# Patient Record
Sex: Female | Born: 1985 | Race: Black or African American | Hispanic: No | Marital: Single | Smoking: Never smoker
Health system: Southern US, Community
[De-identification: ages and names within clinical notes are randomized; demographics above are authoritative.]

## PROBLEM LIST (undated history)

## (undated) DIAGNOSIS — J302 Other seasonal allergic rhinitis: Secondary | ICD-10-CM

## (undated) DIAGNOSIS — E669 Obesity, unspecified: Secondary | ICD-10-CM

## (undated) DIAGNOSIS — J020 Streptococcal pharyngitis: Secondary | ICD-10-CM

---
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
## 2003-06-29 ENCOUNTER — Emergency Department (HOSPITAL_COMMUNITY): Admission: EM | Admit: 2003-06-29 | Discharge: 2003-06-29 | Payer: Self-pay | Admitting: Emergency Medicine

## 2005-09-26 ENCOUNTER — Emergency Department (HOSPITAL_COMMUNITY): Admission: EM | Admit: 2005-09-26 | Discharge: 2005-09-26 | Payer: Self-pay | Admitting: Family Medicine

## 2006-06-10 ENCOUNTER — Emergency Department (HOSPITAL_COMMUNITY): Admission: EM | Admit: 2006-06-10 | Discharge: 2006-06-10 | Payer: Self-pay | Admitting: Emergency Medicine

## 2006-10-16 DIAGNOSIS — E669 Obesity, unspecified: Secondary | ICD-10-CM

## 2006-10-16 DIAGNOSIS — J309 Allergic rhinitis, unspecified: Secondary | ICD-10-CM | POA: Insufficient documentation

## 2006-10-16 DIAGNOSIS — G44209 Tension-type headache, unspecified, not intractable: Secondary | ICD-10-CM

## 2007-10-23 ENCOUNTER — Emergency Department (HOSPITAL_COMMUNITY): Admission: EM | Admit: 2007-10-23 | Discharge: 2007-10-23 | Payer: Self-pay | Admitting: Family Medicine

## 2008-03-17 ENCOUNTER — Emergency Department (HOSPITAL_COMMUNITY): Admission: EM | Admit: 2008-03-17 | Discharge: 2008-03-18 | Payer: Self-pay | Admitting: Emergency Medicine

## 2008-11-03 ENCOUNTER — Emergency Department (HOSPITAL_COMMUNITY): Admission: EM | Admit: 2008-11-03 | Discharge: 2008-11-03 | Payer: Self-pay | Admitting: Family Medicine

## 2009-12-15 ENCOUNTER — Emergency Department (HOSPITAL_COMMUNITY): Admission: EM | Admit: 2009-12-15 | Discharge: 2009-12-15 | Payer: Self-pay | Admitting: Family Medicine

## 2011-05-03 ENCOUNTER — Inpatient Hospital Stay (INDEPENDENT_AMBULATORY_CARE_PROVIDER_SITE_OTHER)
Admission: RE | Admit: 2011-05-03 | Discharge: 2011-05-03 | Disposition: A | Discharge status: Home / Self Care | Payer: Self-pay | Source: Ambulatory Visit | Attending: Family Medicine | Admitting: Family Medicine

## 2011-05-03 DIAGNOSIS — M545 Low back pain: Secondary | ICD-10-CM

## 2011-05-13 LAB — STREP A DNA PROBE: Group A Strep Probe: NEGATIVE

## 2011-05-13 LAB — POCT INFECTIOUS MONO SCREEN: Mono Screen: NEGATIVE

## 2011-05-17 LAB — CBC
HCT: 36.7
Hemoglobin: 12.5
MCHC: 33.9
MCV: 84
Platelets: 285
RBC: 4.37
RDW: 12.8
WBC: 15.1 — ABNORMAL HIGH

## 2011-05-17 LAB — DIFFERENTIAL
Basophils Absolute: 0
Basophils Relative: 0
Eosinophils Absolute: 0.1
Eosinophils Relative: 1
Lymphocytes Relative: 13
Lymphs Abs: 2
Monocytes Absolute: 1.3 — ABNORMAL HIGH
Monocytes Relative: 9
Neutro Abs: 11.6 — ABNORMAL HIGH
Neutrophils Relative %: 77

## 2011-05-17 LAB — RAPID STREP SCREEN (MED CTR MEBANE ONLY): Streptococcus, Group A Screen (Direct): NEGATIVE

## 2011-08-04 ENCOUNTER — Encounter: Payer: Self-pay | Admitting: *Deleted

## 2011-08-04 ENCOUNTER — Emergency Department (INDEPENDENT_AMBULATORY_CARE_PROVIDER_SITE_OTHER)
Admission: EM | Admit: 2011-08-04 | Discharge: 2011-08-04 | Disposition: A | Discharge status: Home / Self Care | Payer: Self-pay | Source: Home / Self Care | Attending: Family Medicine | Admitting: Family Medicine

## 2011-08-04 DIAGNOSIS — J020 Streptococcal pharyngitis: Secondary | ICD-10-CM

## 2011-08-04 DIAGNOSIS — J0300 Acute streptococcal tonsillitis, unspecified: Secondary | ICD-10-CM

## 2011-08-04 HISTORY — DX: Other seasonal allergic rhinitis: J30.2

## 2011-08-04 MED ORDER — PENICILLIN V POTASSIUM 500 MG PO TABS: 500.0000 mg | ORAL_TABLET | Freq: Four times a day (QID) | ORAL | Status: AC

## 2011-08-04 NOTE — ED Provider Notes (Signed)
History     CSN: [id number] Arrival date & time: 08/04/2011  6:50 PM   First MD Initiated Contact with Patient 08/04/11 1858      No chief complaint on file.   (Consider location/radiation/quality/duration/timing/severity/associated sxs/prior treatment) Patient is a 25 y.o. female presenting with pharyngitis. The history is provided by the patient. No language interpreter was used.  Sore Throat This is a new problem. The current episode started yesterday. The problem occurs constantly. The problem has been gradually worsening. Associated symptoms include headaches. The symptoms are aggravated by nothing. The symptoms are relieved by NSAIDs. She has tried nothing for the symptoms. The treatment provided moderate relief.   Pt has a sorethroat with exudate on tonsils No past medical history on file.  No past surgical history on file.  No family history on file.  History  Substance Use Topics  . Smoking status: Not on file  . Smokeless tobacco: Not on file  . Alcohol Use: Not on file    OB History    No data available      Review of Systems  Neurological: Positive for headaches.  All other systems reviewed and are negative.    Allergies  Review of patient's allergies indicates not on file.  Home Medications  No current outpatient prescriptions on file.  There were no vitals taken for this visit.  Physical Exam  Constitutional: She appears well-developed.  HENT:  Head: Normocephalic and atraumatic.  Right Ear: External ear normal.  Left Ear: External ear normal.  Nose: Nose normal.  Mouth/Throat: Oropharyngeal exudate present.  Eyes: Conjunctivae are normal. Pupils are equal, round, and reactive to light.  Neck: Normal range of motion.  Cardiovascular: Normal rate.   Pulmonary/Chest: Effort normal.  Abdominal: Soft.  Musculoskeletal: Normal range of motion.  Neurological: She is alert.  Skin: Skin is warm.  Psychiatric: She has a normal mood and affect.     ED Course  Procedures (including critical care time)  Labs Reviewed  POCT RAPID STREP A ([facility] ONLY) - Abnormal; Notable for the following:    Streptococcus, Group A Screen (Direct) POSITIVE (*)    All other components within normal limits   No results found.   No diagnosis found.    MDM  Strep positive        Langston Masker, [place] 08/04/11 1918

## 2011-08-04 NOTE — ED Provider Notes (Signed)
Medical screening examination/treatment/procedure(s) were performed by non-physician practitioner and as supervising physician I was immediately available for consultation/collaboration.   KINDL,JAMES DOUGLAS MD.    James Douglas Kindl, MD 08/04/11 2010 

## 2011-08-04 NOTE — ED Notes (Signed)
sorethroat with swelling onset today

## 2011-08-26 ENCOUNTER — Encounter (HOSPITAL_COMMUNITY): Payer: Self-pay

## 2011-08-26 ENCOUNTER — Emergency Department (INDEPENDENT_AMBULATORY_CARE_PROVIDER_SITE_OTHER)
Admission: EM | Admit: 2011-08-26 | Discharge: 2011-08-26 | Disposition: A | Discharge status: Home / Self Care | Payer: Self-pay | Source: Home / Self Care | Attending: Emergency Medicine | Admitting: Emergency Medicine

## 2011-08-26 DIAGNOSIS — J020 Streptococcal pharyngitis: Secondary | ICD-10-CM

## 2011-08-26 HISTORY — DX: Streptococcal pharyngitis: J02.0

## 2011-08-26 HISTORY — DX: Obesity, unspecified: E66.9

## 2011-08-26 LAB — POCT RAPID STREP A: Streptococcus, Group A Screen (Direct): POSITIVE — AB

## 2011-08-26 MED ORDER — IBUPROFEN 600 MG PO TABS: 600.0000 mg | ORAL_TABLET | Freq: Four times a day (QID) | ORAL | Status: AC | PRN

## 2011-08-26 MED ORDER — CEPHALEXIN 500 MG PO CAPS: 500.0000 mg | ORAL_CAPSULE | Freq: Four times a day (QID) | ORAL | Status: AC

## 2011-08-26 NOTE — ED Notes (Signed)
C/o sorethroat since Saturday.  Reports being diagnosed with strep throat on 08/04/11, completed antibiotics and sx resolved.  Denies fever or cold sx.

## 2011-08-26 NOTE — ED Provider Notes (Signed)
History     CSN: [id number]  Arrival date & time 08/26/11  1052   First MD Initiated Contact with Patient 08/26/11 1217      Chief Complaint  Patient presents with  . Sore Throat    (Consider location/radiation/quality/duration/timing/severity/associated sxs/prior treatment) HPI Comments: Pt with ST x 3 days. Some L ear pain today. No fevers, N/V, rash, abd pain, voice changes. Able to eat and drink.  dx'd with and tx'd for strep throat 12/16. Home on 10 days of pcn which pt states she finished. States she got better after tx. bp also elevated 140's/90's on that visit  Patient is a 26 y.o. female presenting with pharyngitis. The history is provided by the patient. No language interpreter was used.  Sore Throat This is a new problem. The current episode started more than 2 days ago. The problem occurs constantly. The problem has been gradually worsening. Pertinent negatives include no chest pain, no abdominal pain, no headaches and no shortness of breath. The symptoms are aggravated by swallowing.    Past Medical History  Diagnosis Date  . Seasonal allergies   . Strep throat   . Obesity     History reviewed. No pertinent past surgical history.  History reviewed. No pertinent family history.  History  Substance Use Topics  . Smoking status: Never Smoker   . Smokeless tobacco: Not on file  . Alcohol Use: No    OB History    Grav Para Term Preterm Abortions TAB SAB Ect Mult Living                  Review of Systems  Constitutional: Negative for fever.  HENT: Positive for ear pain, congestion and sore throat. Negative for hearing loss, rhinorrhea, sneezing, drooling, mouth sores, trouble swallowing, voice change, postnasal drip and sinus pressure.   Respiratory: Negative for cough, shortness of breath and wheezing.   Cardiovascular: Negative for chest pain.  Gastrointestinal: Negative for nausea, vomiting and abdominal pain.  Musculoskeletal: Negative for myalgias.    Skin: Negative for rash.  Neurological: Negative for headaches.    Allergies  Review of patient's allergies indicates no known allergies.  Home Medications   Current Outpatient Rx  Name Route Sig Dispense Refill  . CEPHALEXIN 500 MG PO CAPS Oral Take 1 capsule (500 mg total) by mouth 4 (four) times daily. X 10 days 40 capsule 0  . IBUPROFEN 600 MG PO TABS Oral Take 1 tablet (600 mg total) by mouth every 6 (six) hours as needed for pain. 30 tablet 0    BP 160/91  Pulse 94  Temp(Src) 99.5 F (37.5 C) (Oral)  Resp 16  SpO2 100%  LMP 08/04/2011  Physical Exam  Nursing note and vitals reviewed. Constitutional: She is oriented to person, place, and time. She appears well-developed and well-nourished.  HENT:  Head: Normocephalic and atraumatic. No trismus in the jaw.  Right Ear: Tympanic membrane and ear canal normal.  Left Ear: Tympanic membrane and ear canal normal.  Nose: Mucosal edema and rhinorrhea present. Right sinus exhibits no maxillary sinus tenderness and no frontal sinus tenderness. Left sinus exhibits no maxillary sinus tenderness and no frontal sinus tenderness.  Mouth/Throat: Uvula is midline and mucous membranes are normal. Posterior oropharyngeal edema and posterior oropharyngeal erythema present. No oropharyngeal exudate or tonsillar abscesses.       Enlarged, erythematous tonsils bilaterally. No exudates.   Eyes: Conjunctivae and EOM are normal. Pupils are equal, round, and reactive to light.  Neck: Normal  range of motion. Neck supple.  Cardiovascular: Normal rate, regular rhythm and normal heart sounds.   Pulmonary/Chest: Effort normal and breath sounds normal. No respiratory distress. She has no wheezes. She has no rales.  Abdominal: She exhibits no distension. There is no tenderness. There is no rebound and no guarding.  Musculoskeletal: Normal range of motion.  Lymphadenopathy:    She has cervical adenopathy.  Neurological: She is alert and oriented to  person, place, and time.  Skin: Skin is warm and dry. No rash noted.  Psychiatric: She has a normal mood and affect. Her behavior is normal. Judgment and thought content normal.    ED Course  Procedures (including critical care time)  Labs Reviewed  POCT RAPID STREP A ([facility] ONLY) - Abnormal; Notable for the following:    Streptococcus, Group A Screen (Direct) POSITIVE (*)    All other components within normal limits   No results found.   1. Strep pharyngitis       MDM  Previous chart, labs reviewed as noted in HPI. Pt with recurrent pharyngitis, will send home with keflex. Pt hypertensive today. Also discussed BP and lifestyle modifications as important first steps but adivsed she needs to have BP rechecked in a month and may need medication at that time.   Luiz Blare, MD 08/26/11 [phone]

## 2018-05-12 ENCOUNTER — Emergency Department (HOSPITAL_COMMUNITY)
Admission: EM | Admit: 2018-05-12 | Discharge: 2018-05-12 | Disposition: A | Discharge status: Home / Self Care | Payer: Self-pay | Attending: Emergency Medicine | Admitting: Emergency Medicine

## 2018-05-12 ENCOUNTER — Encounter (HOSPITAL_COMMUNITY): Payer: Self-pay | Admitting: *Deleted

## 2018-05-12 ENCOUNTER — Emergency Department (HOSPITAL_COMMUNITY): Payer: Self-pay

## 2018-05-12 ENCOUNTER — Other Ambulatory Visit: Payer: Self-pay

## 2018-05-12 DIAGNOSIS — Y999 Unspecified external cause status: Secondary | ICD-10-CM | POA: Insufficient documentation

## 2018-05-12 DIAGNOSIS — Y929 Unspecified place or not applicable: Secondary | ICD-10-CM | POA: Insufficient documentation

## 2018-05-12 DIAGNOSIS — S92251A Displaced fracture of navicular [scaphoid] of right foot, initial encounter for closed fracture: Secondary | ICD-10-CM | POA: Insufficient documentation

## 2018-05-12 DIAGNOSIS — M79662 Pain in left lower leg: Secondary | ICD-10-CM | POA: Insufficient documentation

## 2018-05-12 DIAGNOSIS — Z79899 Other long term (current) drug therapy: Secondary | ICD-10-CM | POA: Insufficient documentation

## 2018-05-12 DIAGNOSIS — W1842XA Slipping, tripping and stumbling without falling due to stepping into hole or opening, initial encounter: Secondary | ICD-10-CM | POA: Insufficient documentation

## 2018-05-12 DIAGNOSIS — Y939 Activity, unspecified: Secondary | ICD-10-CM | POA: Insufficient documentation

## 2018-05-12 MED ORDER — IBUPROFEN 200 MG PO TABS
600.0000 mg | ORAL_TABLET | Freq: Once | ORAL | Status: AC
  Administered 2018-05-12: 600 mg via ORAL
  Filled 2018-05-12: qty 3

## 2018-05-12 MED ORDER — IBUPROFEN 600 MG PO TABS: 600.0000 mg | ORAL_TABLET | Freq: Four times a day (QID) | ORAL | 0 refills | Status: DC | PRN

## 2018-05-12 NOTE — ED Triage Notes (Signed)
Pt reports rolling on her right foot on Saturday.  Pt thought it may have been a sprain so she had wrapped it and elevated it.  Today the pain and swelling got worse.  Pt a/o x 4.

## 2018-05-12 NOTE — ED Provider Notes (Signed)
[facility]-EMERGENCY DEPT Provider Note   CSN: [id number] Arrival date & time: 05/12/18  1311     History   Chief Complaint Chief Complaint  Patient presents with  . Foot Pain    right    HPI Amy Keller is a 32 y.o. female.  HPI   Amy Keller is a 32 y.o. female, with a history of obesity, presenting to the ED with injury to the right foot and ankle that occurred September 21.  States she stepped in a hole and inverted her right ankle.  She has pain primarily to the anterior ankle and dorsal foot.  Accompanied by swelling.  Pain is throbbing/sharp, 5/10, radiating proximally along the tibia. She has been taking ibuprofen with some relief.  Last ibuprofen was last night.  Denies neck/back pain, numbness, weakness, hip pain, or any other injuries or complaints.   Past Medical History:  Diagnosis Date  . Obesity   . Seasonal allergies   . Strep throat     Patient Active Problem List   Diagnosis Date Noted  . OBESITY, NOS 10/16/2006  . TENSION HEADACHE 10/16/2006  . RHINITIS, ALLERGIC 10/16/2006    History reviewed. No pertinent surgical history.   OB History   None      Home Medications    Prior to Admission medications   Medication Sig Start Date End Date Taking? Authorizing Provider  cetirizine (ZYRTEC) 10 MG tablet Take 10 mg by mouth daily.   Yes [provider]  ibuprofen (ADVIL,MOTRIN) 200 MG tablet Take 200-800 mg by mouth every 6 (six) hours as needed for moderate pain.   Yes [provider]    Family History No family history on file.  Social History Social History   Tobacco Use  . Smoking status: Never Smoker  . Smokeless tobacco: Never Used  Substance Use Topics  . Alcohol use: No  . Drug use: No     Allergies   Patient has no known allergies.   Review of Systems Review of Systems  Musculoskeletal: Positive for arthralgias. Negative for back pain and neck pain.  Neurological:  Negative for weakness and numbness.     Physical Exam Updated Vital Signs BP (!) 164/97 (BP Location: Left Arm)   Pulse 98   Temp 98.7 F (37.1 C) (Oral)   Resp 17   Ht 5\' 7"  (1.702 m)   LMP 04/26/2018   SpO2 99%   Physical Exam  Constitutional: She appears well-developed and well-nourished. No distress.  HENT:  Head: Normocephalic and atraumatic.  Eyes: Conjunctivae are normal.  Neck: Neck supple.  Cardiovascular: Normal rate, regular rhythm and intact distal pulses.  Pulmonary/Chest: Effort normal.  Musculoskeletal: She exhibits edema and tenderness. She exhibits no deformity.       Legs:      Feet:  Swelling and tenderness to the right anterior ankle and into the dorsal foot without noted deformity or instability.  Full range of motion in the ankle, though painful. Mild tenderness and some pain with range of motion at the right proximal tibia without noted instability or swelling.  Neurological: She is alert.  Skin: Skin is warm and dry. Capillary refill takes less than 2 seconds. She is not diaphoretic. No pallor.  Psychiatric: She has a normal mood and affect. Her behavior is normal.  Nursing note and vitals reviewed.    ED Treatments / Results  Labs (all labs ordered are listed, but only abnormal results are displayed) Labs Reviewed -  No data to display  EKG None  Radiology Dg Tibia/fibula Right  Result Date: 05/12/2018 CLINICAL DATA:  Twisting injury of right foot and ankle. EXAM: RIGHT TIBIA AND FIBULA - 2 VIEW COMPARISON:  None. FINDINGS: Soft tissue swelling about the ankle. No acute bony abnormality. Specifically, no fracture, subluxation, or dislocation. Joint spaces maintained. IMPRESSION: No acute bony abnormality. Electronically Signed   By: Charlett NoseKevin  Dover M.D.   On: 05/12/2018 17:56   Dg Ankle Complete Right  Result Date: 05/12/2018 CLINICAL DATA:  Patient rolled ankle on Saturday and presents with worsening pain. EXAM: RIGHT ANKLE - COMPLETE 3+ VIEW  COMPARISON:  None. FINDINGS: Soft tissue swelling about the malleoli more so medially. Slightly displaced appearing lucency of the navicular bone is identified medially extending into the navicular-cuneiform articulation. The tibiotalar, subtalar and midfoot articulations are maintained. There is a small plantar calcaneal enthesophyte. IMPRESSION: Soft tissue swelling about the malleoli. Suspicious lucency involving the tarsal navicular with slight displacement compatible with a recent fracture. Electronically Signed   By: Tollie Ethavid  Kwon M.D.   On: 05/12/2018 15:51   Dg Foot Complete Right  Result Date: 05/12/2018 CLINICAL DATA:  Patient rolled foot on Saturday and presents with worsening pain and swelling. EXAM: RIGHT FOOT COMPLETE - 3+ VIEW COMPARISON:  None. FINDINGS: There is moderate soft tissue swelling about the malleoli and dorsum of the forefoot. Subtle transverse lucency along the posterior medial aspect of the tarsal navicular is identified suspicious for a nondisplaced fracture at site of tibialis posterior attachment. Subtle ill-defined lucencies involving the plantar medial aspect of the calcaneus anteriorly is nonspecific but could potentially represent subtle fracture as well. The tibiotalar and subtalar joints are maintained. No joint dislocation is identified. Tiny plantar calcaneal enthesophyte is identified. IMPRESSION: Suspicious transverse lucency along the posteromedial aspect of the tarsal navicular with overlying soft tissue swelling is noted. Findings are suspicious for a non avulsed, nondisplaced fracture at site of tibialis posterior insertion. Ill-defined nonspecific lucency of the anteromedial calcaneus would be difficult to entirely exclude a fracture. CT may help for better assessment. Electronically Signed   By: Tollie Ethavid  Kwon M.D.   On: 05/12/2018 15:39    Procedures Procedures (including critical care time)  Medications Ordered in ED Medications  ibuprofen (ADVIL,MOTRIN)  tablet 600 mg (600 mg Oral Given 05/12/18 1610)     Initial Impression / Assessment and Plan / ED Course  I have reviewed the triage vital signs and the nursing notes.  Pertinent labs & imaging results that were available during my care of the patient were reviewed by me and considered in my medical decision making (see chart for details).  Clinical Course as of May 12 1801  Tue May 12, 2018  1703 Spoke with Dr. Aundria Rudogers, orthopedist. Recommends placing patient in cam boot with crutches. He can see her in the office within 5-7 days.    [SJ]    Clinical Course User Index [SJ] Joy, Shawn C, PA-C   Patient presents with a right foot injury.  Neurovascularly intact.  Evidence of tarsal navicular fracture on x-ray.  Cam boot, crutches, and orthopedic follow-up. The patient was given instructions for home care as well as return precautions. Patient voices understanding of these instructions, accepts the plan, and is comfortable with discharge.  Findings and plan of care discussed with Virgina NorfolkAdam Curatolo, DO.   Final Clinical Impressions(s) / ED Diagnoses   Final diagnoses:  Closed displaced fracture of navicular bone of right foot, initial encounter    ED  Discharge Orders    None       Concepcion Living 05/12/18 1802    Virgina Norfolk, DO 05/13/18 0021

## 2018-05-12 NOTE — ED Notes (Signed)
Ortho tech contacted to apply cam walker and crutches

## 2018-05-12 NOTE — Discharge Instructions (Addendum)
You have been seen today for a foot/ankle injury. There was evidence of a fracture on the xray.  Antiinflammatory medications: Take 600 mg of ibuprofen every 6 hours or 440 mg (over the counter dose) to 500 mg (prescription dose) of naproxen every 12 hours for the next 3 days. After this time, these medications may be used as needed for pain. Take these medications with food to avoid upset stomach. Choose only one of these medications, do not take them together. Acetaminophen (generic for Tylenol): Should you continue to have additional pain while taking the ibuprofen or naproxen, you may add in acetaminophen as needed. Your daily total maximum amount of acetaminophen from all sources should be limited to 4000mg /day for persons without liver problems, or 2000mg /day for those with liver problems. Ice: May apply ice to the area over the next 24 hours for 15 minutes at a time to reduce swelling. Elevation: Keep the extremity elevated as often as possible to reduce pain and inflammation. Support: Wear the cam boot for support and comfort. Wear this until pain resolves. You will be weight-bearing as tolerated, which means you can slowly start to put weight on the extremity and increase amount and frequency as pain allows. Follow up: Follow-up with the orthopedic specialist on this matter.  Call the number provided to set up an appointment.  They should see you within the next 5 to 7 days. Return: Return to the ED for numbness, weakness, increasing pain, overall worsening symptoms, loss of function, or if symptoms are not improving, you have tried to follow up with the orthopedic specialist, and have been unable to do so.  For prescription assistance, may try using prescription discount sites or apps, such as goodrx.com

## 2018-05-12 NOTE — ED Notes (Signed)
Pt reports stepping in a hole Saturday twisting her R ankle.  Edema noted in her R ankle laterally.  R pedal pulse palpated.  She is able to move her toes without difficulty.  She was able to ambulate to the room with a limp.

## 2018-08-18 ENCOUNTER — Telehealth: Payer: Self-pay | Admitting: Physician Assistant

## 2018-08-18 DIAGNOSIS — M545 Low back pain, unspecified: Secondary | ICD-10-CM

## 2018-08-18 MED ORDER — NAPROXEN 500 MG PO TABS: 500.0000 mg | ORAL_TABLET | Freq: Two times a day (BID) | ORAL | 0 refills | Status: DC

## 2018-08-18 MED ORDER — CYCLOBENZAPRINE HCL 10 MG PO TABS: 5.0000 mg | ORAL_TABLET | Freq: Three times a day (TID) | ORAL | 0 refills | Status: DC | PRN

## 2018-08-18 NOTE — Progress Notes (Signed)
We are sorry that you are not feeling well.  Here is how we plan to help!  Based on what you have shared with me it looks like you mostly have acute back pain.  Acute back pain is defined as musculoskeletal pain that can resolve in 1-3 weeks with conservative treatment.  I have prescribed Naprosyn 500 mg twice a day non-steroid anti-inflammatory (NSAID) as well as Flexeril 10 mg every eight hours as needed which is a muscle relaxer  Some patients experience stomach irritation or in increased heartburn with anti-inflammatory drugs.  Please keep in mind that muscle relaxer's can cause fatigue and should not be taken while at work or driving.  Back pain is very common.  The pain often gets better over time.  The cause of back pain is usually not dangerous.  Most people can learn to manage their back pain on their own.  Home Care Stay active.  Start with short walks on flat ground if you can.  Try to walk farther each day. Do not sit, drive or stand in one place for more than 30 minutes.  Do not stay in bed. Do not avoid exercise or work.  Activity can help your back heal faster. Be careful when you bend or lift an object.  Bend at your knees, keep the object close to you, and do not twist. Sleep on a firm mattress.  Lie on your side, and bend your knees.  If you lie on your back, put a pillow under your knees. Only take medicines as told by your doctor. Put ice on the injured area. Put ice in a plastic bag Place a towel between your skin and the bag Leave the ice on for 15-20 minutes, 3-4 times a day for the first 2-3 days. 210 After that, you can switch between ice and heat packs. Ask your doctor about back exercises or massage. Avoid feeling anxious or stressed.  Find good ways to deal with stress, such as exercise.  Get Help Right Way If: Your pain does not go away with rest or medicine. Your pain does not go away in 1 week. You have new problems. You do not feel well. The pain spreads  into your legs. You cannot control when you poop (bowel movement) or pee (urinate) You feel sick to your stomach (nauseous) or throw up (vomit) You have belly (abdominal) pain. You feel like you may pass out (faint). If you develop a fever.  Make Sure you: Understand these instructions. Will watch your condition Will get help right away if you are not doing well or get worse.  Your e-visit answers were reviewed by a board certified advanced clinical practitioner to complete your personal care plan.  Depending on the condition, your plan could have included both over the counter or prescription medications.  If there is a problem please reply once you have received a response from your provider.  Your safety is important to [place].  If you have drug allergies check your prescription carefully.    You can use MyChart to ask questions about today's visit, request a non-urgent call back, or ask for a work or school excuse for 24 hours related to this e-Visit. If it has been greater than 24 hours you will need to follow up with your provider, or enter a new e-Visit to address those concerns.  You will get an e-mail in the next two days asking about your experience.  I hope that your e-visit has been valuable  and will speed your recovery. Thank you for using e-visits.   ===View-only below this line===   ----- Message -----    From: Amy Keller    Sent: 08/18/2018 10:09 AM EST      To: E-Visit Mailing List Subject: E-Visit Submission: Back Pain  E-Visit Submission: Back Pain --------------------------------  Question: Where are you having pain Answer:   Lower Back  Question: Does the pain extend into your legs? Answer:   Yes, into both legs  Question: How bad is the pain? Answer:   The pain is severe  Question: Did you have an injury that caused the pain? Answer:   Yes, the pain started after an injury  Question: Please describe the circumstances of your injury Answer:   We  were cleaning out my aunts apartment yesterday and i went to stand up after bending over and felt a sharp pain and couldn't move, I had to have help sitting down. It felt like my back went out completely.  Question: Was your injury related to your job? Answer:   No  Question: How long has the pain been present? Answer:   Today and yesterday  Question: Have you had back pain in the past? Answer:   Yes, I have infrequently had pain similar to this before  Question: Please list any medications you have previously taken for back pain. Answer:   [facility] went to [facility] few years back for pains similar to this. I was prescribed A pain reliever and a muscle relaxer. They helped out Immediately with the pain.  Question: Do you have a fever? Answer:   No, I do not have a fever  Question: Do you have any of the following? Answer:   None of the above  Question: What makes the pain worse? Answer:   Any movement  Question: What makes the pain better Answer:   Pain medicine  Question: Have you ever been diagnosed with cancer? Answer:   No  Question: Have you ever been diagnosed with arthritis? Answer:   No  Question: Have you ever been diagnosed with osteoporosis or any other bone weakness? Answer:   No  Question: Have you ever had surgery on your back or spine? Answer:   No  Question: What is your usual health status? Answer:   I am active and can move normally  Question: Are you pregnant? Answer:   I am confident that I am not pregnant  Question: Are you breastfeeding? Answer:   No  Question: Please list your medication allergies that you may have ? (If 'none' , please list as 'none') Answer:   None  Question: Please list any additional comments  Answer:   I have been tak8ng 800ml of over the counter  Ibprophen and using a heating pad since it happend, but am not getting any relief.

## 2019-12-06 IMAGING — CR DG ANKLE COMPLETE 3+V*R*
3 series · 3 of 3 positions shown · non-contrast
Comparison: None.

CLINICAL DATA: Patient rolled ankle on [REDACTED] and presents with
worsening pain.

EXAM:
RIGHT ANKLE - COMPLETE 3+ VIEW

[x ankle lat right]
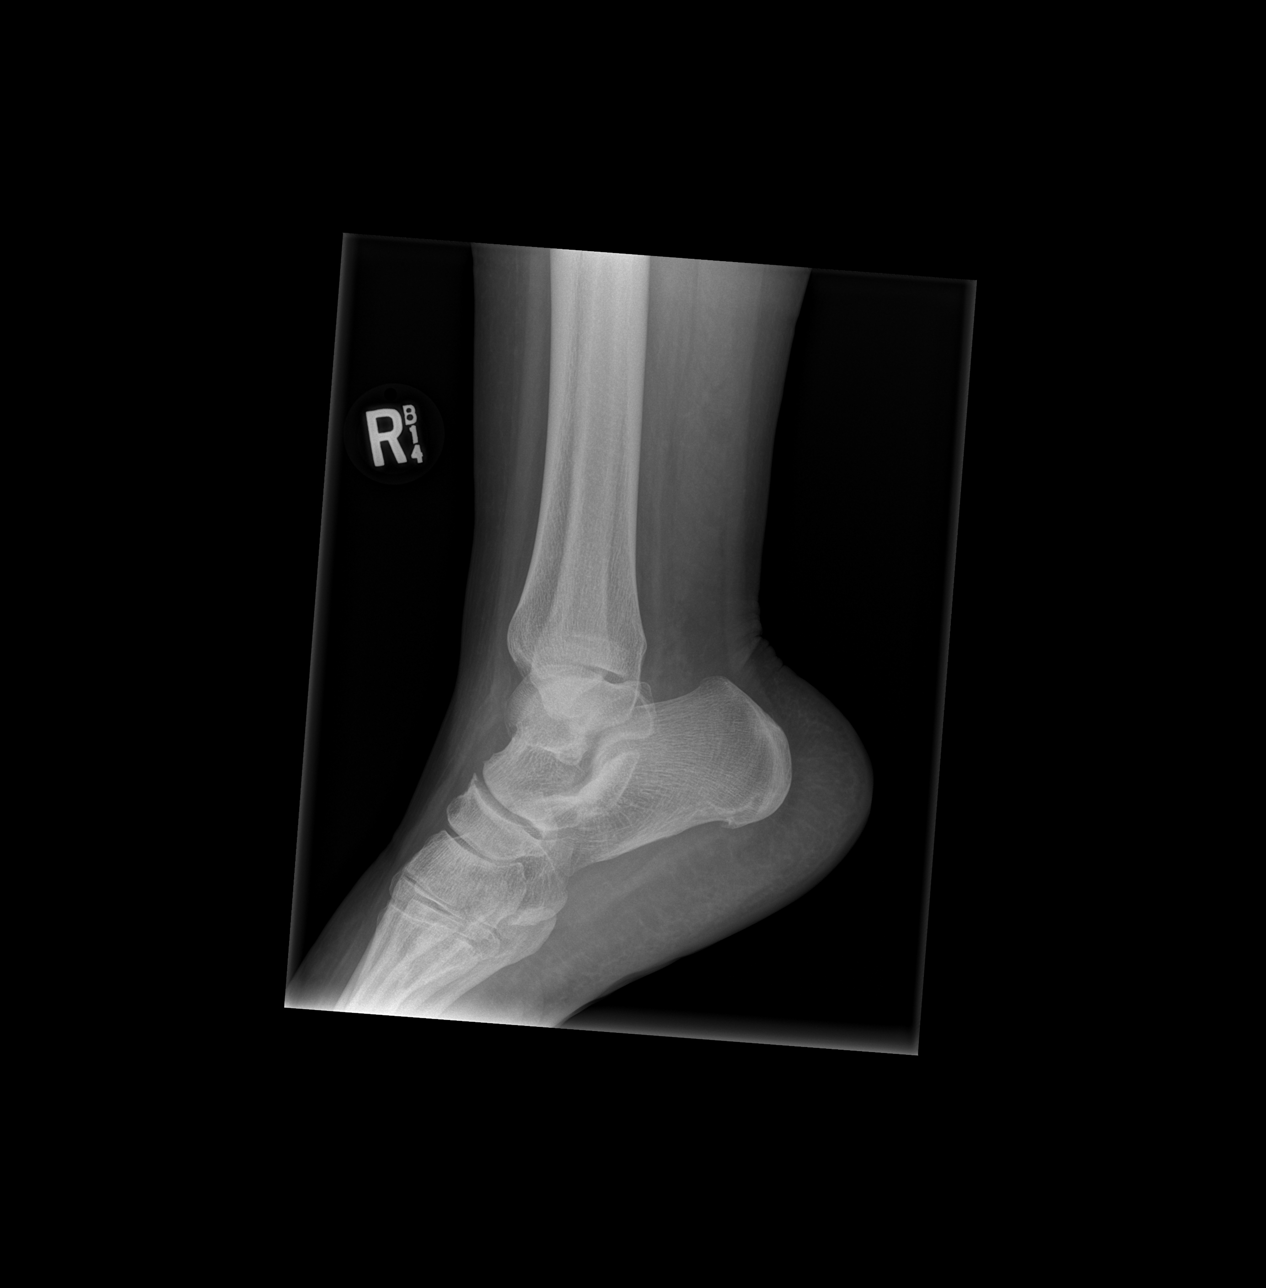

[x ankle ap right]
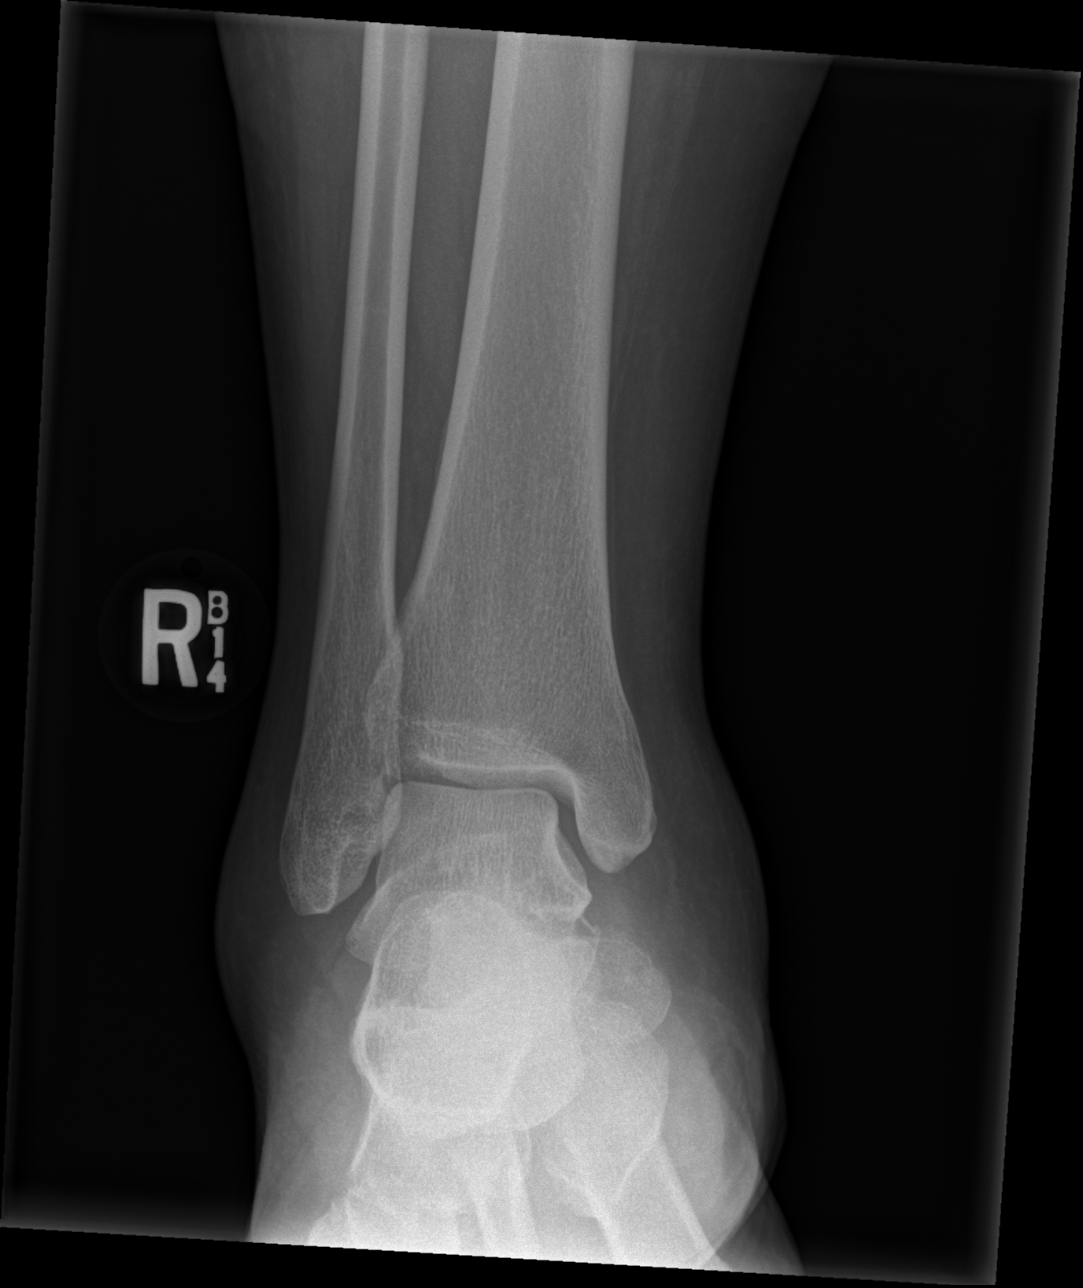

[x ankle obl right]
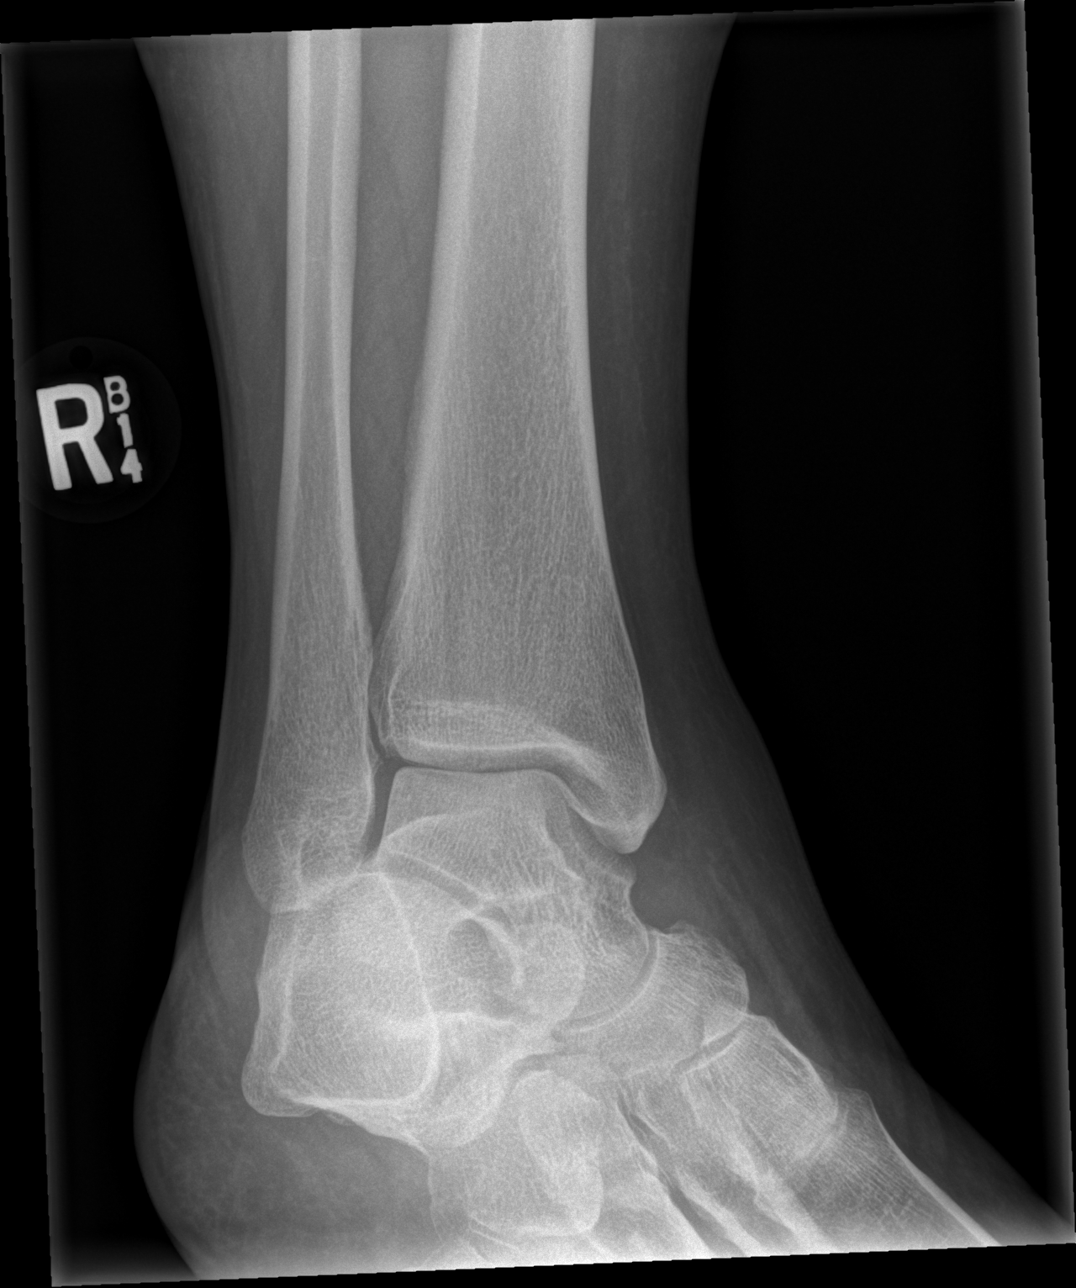

[3 of 3 positions shown; findings below may reference images not displayed]

FINDINGS: Soft tissue swelling about the malleoli more so medially. Slightly
displaced appearing lucency of the navicular bone is identified
medially extending into the navicular-cuneiform articulation. The
tibiotalar, subtalar and midfoot articulations are maintained. There
is a small plantar calcaneal enthesophyte.
IMPRESSION: Soft tissue swelling about the malleoli. Suspicious lucency
involving the tarsal navicular with slight displacement compatible
with a recent fracture.

## 2020-04-17 ENCOUNTER — Ambulatory Visit: Payer: Self-pay | Admitting: Podiatry

## 2020-04-19 ENCOUNTER — Telehealth (INDEPENDENT_AMBULATORY_CARE_PROVIDER_SITE_OTHER): Payer: 59 | Admitting: Podiatry

## 2020-04-19 ENCOUNTER — Ambulatory Visit (INDEPENDENT_AMBULATORY_CARE_PROVIDER_SITE_OTHER): Payer: 59 | Admitting: Podiatry

## 2020-04-19 ENCOUNTER — Encounter: Payer: Self-pay | Admitting: Podiatry

## 2020-04-19 ENCOUNTER — Ambulatory Visit (INDEPENDENT_AMBULATORY_CARE_PROVIDER_SITE_OTHER): Payer: 59

## 2020-04-19 ENCOUNTER — Other Ambulatory Visit: Payer: Self-pay

## 2020-04-19 DIAGNOSIS — M778 Other enthesopathies, not elsewhere classified: Secondary | ICD-10-CM

## 2020-04-19 DIAGNOSIS — M7750 Other enthesopathy of unspecified foot: Secondary | ICD-10-CM | POA: Diagnosis not present

## 2020-04-19 DIAGNOSIS — M7751 Other enthesopathy of right foot: Secondary | ICD-10-CM | POA: Diagnosis not present

## 2020-04-19 DIAGNOSIS — M779 Enthesopathy, unspecified: Secondary | ICD-10-CM

## 2020-04-19 NOTE — Progress Notes (Signed)
Subjective:   Patient ID: Amy Keller, female   DOB: 34 y.o.   MRN: [id number]   HPI Patient presents stating that she broke her foot 2 years ago had a cast put on and only wear a very short period of time and states that since that time she has had swelling in her ankle and into her foot and it is low-grade but bothersome and she cannot be active.  Patient is moderately obese and this is part of the problem does not smoke likes to be active   Review of Systems  All other systems reviewed and are negative.       Objective:  Physical Exam Vitals and nursing note reviewed.  Constitutional:      Appearance: She is well-developed.  Pulmonary:     Effort: Pulmonary effort is normal.  Musculoskeletal:        General: Normal range of motion.  Skin:    General: Skin is warm.  Neurological:     Mental Status: She is alert.     Neurovascular status was found to be intact muscle strength was found to be adequate range of motion was slightly reduced right subtalar joint.  The area that they saw the previous fracture navicular looks fine and I pressed deep into the tissue and did not note pathology there is quite a bit of discomfort into the sinus tarsi right with moderate edema in the ankle negative Denna Haggard' sign noted.  Patient has good digital perfusion well oriented x3     Assessment:  Probability for inflammatory sinus tarsitis which may be related to compensation from the previous injury which appears to be at the navicular proximal portion     Plan:  H&P multiple x-rays reviewed and today I did sterile prep and injected the sinus tarsi 3 mg Kenalog 5 mg Xylocaine dispensed ankle compression stocking advised on elevation and will be seen back to recheck again in the next 4 weeks.  Educated her on nature of compensation deformity x-rays indicate that there appears to be a previous fracture of the navicular but it appears to be healed and I did not note any subtalar joint or ankle  arthritis

## 2020-04-19 NOTE — Telephone Encounter (Signed)
Patient had foot injection. She called wanting to know if there are any restrictions; specifically if she can work out at [employer]

## 2020-04-20 ENCOUNTER — Telehealth (INDEPENDENT_AMBULATORY_CARE_PROVIDER_SITE_OTHER): Payer: 59 | Admitting: Podiatry

## 2020-04-20 NOTE — Telephone Encounter (Signed)
No restrictions

## 2020-04-20 NOTE — Telephone Encounter (Signed)
Phoned patient and LVM informing her she has no restrictions per Dr. Charlsie Merles. She may go to the gym.

## 2020-04-21 ENCOUNTER — Telehealth: Payer: Self-pay | Admitting: *Deleted

## 2020-04-21 NOTE — Telephone Encounter (Signed)
Left a voicemail message with patient telling her that Dr. Charlsie Merles said there are no restrictions for patient to work out. Patient can work out.

## 2020-05-10 ENCOUNTER — Ambulatory Visit: Payer: 59 | Admitting: Podiatry

## 2020-05-17 ENCOUNTER — Other Ambulatory Visit: Payer: Self-pay

## 2020-05-17 ENCOUNTER — Encounter: Payer: Self-pay | Admitting: Podiatry

## 2020-05-17 ENCOUNTER — Ambulatory Visit (INDEPENDENT_AMBULATORY_CARE_PROVIDER_SITE_OTHER): Payer: 59 | Admitting: Podiatry

## 2020-05-17 DIAGNOSIS — M7750 Other enthesopathy of unspecified foot: Secondary | ICD-10-CM

## 2020-05-17 DIAGNOSIS — M779 Enthesopathy, unspecified: Secondary | ICD-10-CM

## 2020-05-17 MED ORDER — DICLOFENAC SODIUM 75 MG PO TBEC: 75.0000 mg | DELAYED_RELEASE_TABLET | Freq: Two times a day (BID) | ORAL | 2 refills | Status: DC

## 2020-05-17 NOTE — Progress Notes (Signed)
Subjective:   Patient ID: Amy Keller, female   DOB: 34 y.o.   MRN: [id number]   HPI Patient presents stating her foot feels some better but it seems to hurt more in the ankle now than it does in the other joint and again obesity is complicating factor for this patient   ROS      Objective:  Physical Exam  Sinus tarsitis right which seems to be improving with ankle discomfort in the dorsum of the ankle joint present with patient also has mild edema and obesity which is complicating factor     Assessment:  Ankle inflammation of the dorsal capsule along with improved sinus tarsitis right     Plan:  H&P condition reviewed sterile prep done and injected the dorsal ankle 3 mg Kenalog 5 mg Xylocaine advised on reduced activity and reappoint to recheck

## 2020-08-02 ENCOUNTER — Other Ambulatory Visit: Payer: Self-pay

## 2020-08-02 ENCOUNTER — Encounter: Payer: Self-pay | Admitting: Podiatry

## 2020-08-02 ENCOUNTER — Ambulatory Visit (INDEPENDENT_AMBULATORY_CARE_PROVIDER_SITE_OTHER): Payer: 59 | Admitting: Podiatry

## 2020-08-02 DIAGNOSIS — M76821 Posterior tibial tendinitis, right leg: Secondary | ICD-10-CM | POA: Diagnosis not present

## 2020-08-02 MED ORDER — TRIAMCINOLONE ACETONIDE 10 MG/ML IJ SUSP
10.0000 mg | Freq: Once | INTRAMUSCULAR | Status: AC
  Administered 2020-08-02: 15:00:00 10 mg

## 2020-08-04 NOTE — Progress Notes (Signed)
Subjective:   Patient ID: Amy Keller, female   DOB: 34 y.o.   MRN: [id number]   HPI Patient presents stating that I am still getting pain in my foot and I am not sure if I get an MRI and states it is in somewhat of a different area but I still have problems   ROS      Objective:  Physical Exam  Neurovascular status intact muscle strength adequate with lateral dorsal pain which seems to be at least partially resolved with medial pain which is now more bothersome with certainly obesity being a complicating factor for this patient      Assessment:  Improvement of dorsal lateral pain with inflammation now more posterior to with obesity is factor     Plan:  H&P reviewed conditions and explained all this to patient.  I do not see right now where MRI will be of help due to the moving around pain and not being concentrated in one area.  Might be possible 1 point in future but I want to continue to try to hold off and today I met a focus on posterior tib and I did advised on supportive shoes and I went ahead did sterile prep and injected the posterior tib sheath 3 mg dexamethasone Kenalog 5 mg Xylocaine.  Reappoint as needed

## 2020-09-11 ENCOUNTER — Telehealth: Payer: Self-pay | Admitting: Family

## 2020-09-11 DIAGNOSIS — U071 COVID-19: Secondary | ICD-10-CM

## 2020-09-11 MED ORDER — ALBUTEROL SULFATE HFA 108 (90 BASE) MCG/ACT IN AERS: 2.0000 | INHALATION_SPRAY | Freq: Four times a day (QID) | RESPIRATORY_TRACT | 0 refills | Status: DC | PRN

## 2020-09-11 MED ORDER — DEXAMETHASONE 6 MG PO TABS: 6.0000 mg | ORAL_TABLET | Freq: Two times a day (BID) | ORAL | 0 refills | Status: DC

## 2020-09-11 MED ORDER — FLUTICASONE PROPIONATE 50 MCG/ACT NA SUSP: 2.0000 | Freq: Every day | NASAL | 6 refills | Status: DC

## 2020-09-11 MED ORDER — BENZONATATE 100 MG PO CAPS: 100.0000 mg | ORAL_CAPSULE | Freq: Three times a day (TID) | ORAL | 0 refills | Status: DC | PRN

## 2020-09-11 NOTE — Progress Notes (Signed)
E-Visit for Corona Virus Screening  We are sorry you are not feeling well. We are here to help!  You have tested positive for COVID-19, meaning that you were infected with the novel coronavirus and could give the virus to others.  It is vitally important that you stay home so you do not spread it to others.      Please continue isolation at home, for at least 10 days since the start of your symptoms and until you have had 24 hours with no fever (without taking a fever reducer) and with improving of symptoms.  If you have no symptoms but tested positive (or all symptoms resolve after 5 days and you have no fever) you can leave your house but continue to wear a mask around others for an additional 5 days. If you have a fever,continue to stay home until you have had 24 hours of no fever. Most cases improve 5-10 days from onset but we have seen a small number of patients who have gotten worse after the 10 days.  Please be sure to watch for worsening symptoms and remain taking the proper precautions.   Go to the nearest hospital ED for assessment if fever/cough/breathlessness are severe or illness seems like a threat to life.    The following symptoms may appear 2-14 days after exposure: . Fever . Cough . Shortness of breath or difficulty breathing . Chills . Repeated shaking with chills . Muscle pain . Headache . Sore throat . New loss of taste or smell . Fatigue . Congestion or runny nose . Nausea or vomiting . Diarrhea  You have been enrolled in MyChart Home Monitoring for COVID-19. Daily you will receive a questionnaire within the MyChart website. Our COVID-19 response team will be monitoring your responses daily.  You can use medication such as A prescription cough medication called Tessalon Perles 100 mg. You may take 1-2 capsules every 8 hours as needed for cough, A prescription inhaler called Albuterol MDI 90 mcg /actuation 2 puffs every 4 hours as needed for shortness of breath,  wheezing, cough and A prescription for Fluticasone nasal spray 2 sprays in each nostril one time per day and dexamethasone 6 mg twice a day for 7 days.   You may also take acetaminophen (Tylenol) as needed for fever.  HOME CARE: . Only take medications as instructed by your medical team. . Drink plenty of fluids and get plenty of rest. . A steam or ultrasonic humidifier can help if you have congestion.   GET HELP RIGHT AWAY IF YOU HAVE EMERGENCY WARNING SIGNS.  Call 911 or proceed to your closest emergency facility if: . You develop worsening high fever. . Trouble breathing . Bluish lips or face . Persistent pain or pressure in the chest . New confusion . Inability to wake or stay awake . You cough up blood. . Your symptoms become more severe . Inability to hold down food or fluids  This list is not all possible symptoms. Contact your medical provider for any symptoms that are severe or concerning to you.    Your e-visit answers were reviewed by a board certified advanced clinical practitioner to complete your personal care plan.  Depending on the condition, your plan could have included both over the counter or prescription medications.  If there is a problem please reply once you have received a response from your provider.  Your safety is important to us.  If you have drug allergies check your prescription carefully.      You can use MyChart to ask questions about today's visit, request a non-urgent call back, or ask for a work or school excuse for 24 hours related to this e-Visit. If it has been greater than 24 hours you will need to follow up with your provider, or enter a new e-Visit to address those concerns. You will get an e-mail in the next two days asking about your experience.  I hope that your e-visit has been valuable and will speed your recovery. Thank you for using e-visits.    Approximately 5 minutes was spent documenting and reviewing patient's chart.      

## 2020-09-12 ENCOUNTER — Telehealth: Payer: Self-pay | Admitting: Family

## 2020-09-12 DIAGNOSIS — M546 Pain in thoracic spine: Secondary | ICD-10-CM

## 2020-09-12 MED ORDER — NAPROXEN 500 MG PO TABS: 500.0000 mg | ORAL_TABLET | Freq: Two times a day (BID) | ORAL | 0 refills | Status: DC

## 2020-09-12 MED ORDER — BACLOFEN 10 MG PO TABS: 10.0000 mg | ORAL_TABLET | Freq: Three times a day (TID) | ORAL | 0 refills | Status: DC

## 2020-09-12 NOTE — Progress Notes (Signed)

## 2021-05-08 ENCOUNTER — Telehealth: Payer: Self-pay | Admitting: Nurse Practitioner

## 2021-05-08 DIAGNOSIS — J0140 Acute pansinusitis, unspecified: Secondary | ICD-10-CM

## 2021-05-08 MED ORDER — AMOXICILLIN-POT CLAVULANATE 875-125 MG PO TABS: 1.0000 | ORAL_TABLET | Freq: Two times a day (BID) | ORAL | 0 refills | Status: AC

## 2021-05-08 NOTE — Progress Notes (Signed)
E-Visit for Sinus Problems  We are sorry that you are not feeling well.  Here is how we plan to help!  To clarify, you will need to continue allergy medications and decongestants to help continue to dry up the mucous. An antibiotic will neutralize bacteria, but does not dry up mucous itself. This is why it is essential for you to continue your allergy medications daily, and continue decongestant use until symptoms improve.  Based on what you have shared with me it looks like you have sinusitis.  Sinusitis is inflammation and infection in the sinus cavities of the head.  Based on your presentation I believe you most likely have Acute Bacterial Sinusitis.  This is an infection caused by bacteria and is treated with antibiotics. I have prescribed Augmentin 875mg /125mg  one tablet twice daily with food, for 7 days. You may use an oral decongestant such as Mucinex D or if you have glaucoma or high blood pressure use plain Mucinex. Saline nasal spray help and can safely be used as often as needed for congestion.  If you develop worsening sinus pain, fever or notice severe headache and vision changes, or if symptoms are not better after completion of antibiotic, please schedule an appointment with a health care provider.    Sinus infections are not as easily transmitted as other respiratory infection, however we still recommend that you avoid close contact with loved ones, especially the very young and elderly.  Remember to wash your hands thoroughly throughout the day as this is the number one way to prevent the spread of infection!  Home Care: Only take medications as instructed by your medical team. Complete the entire course of an antibiotic. Do not take these medications with alcohol. A steam or ultrasonic humidifier can help congestion.  You can place a towel over your head and breathe in the steam from hot water coming from a faucet. Avoid close contacts especially the very young and the  elderly. Cover your mouth when you cough or sneeze. Always remember to wash your hands.  Get Help Right Away If: You develop worsening fever or sinus pain. You develop a severe head ache or visual changes. Your symptoms persist after you have completed your treatment plan.  Make sure you Understand these instructions. Will watch your condition. Will get help right away if you are not doing well or get worse.  Thank you for choosing an e-visit.  Your e-visit answers were reviewed by a board certified advanced clinical practitioner to complete your personal care plan. Depending upon the condition, your plan could have included both over the counter or prescription medications.  Please review your pharmacy choice. Make sure the pharmacy is open so you can pick up prescription now. If there is a problem, you may contact your provider through and have the prescription routed to another pharmacy.  Your safety is important to [employer]. If you have drug allergies check your prescription carefully.     For the next 24 hours you can use MyChart to ask questions about today's visit, request a non-urgent call back, or ask for a work or school excuse. You will get an email in the next two days asking about your experience. I hope that your e-visit has been valuable and will speed your recovery.   I spent approximately 7 minutes reviewing the patient's history, current symptoms and coordinating their plan of care today.    Meds ordered this encounter  Medications   amoxicillin-clavulanate (AUGMENTIN) 875-125 MG tablet  Sig: Take 1 tablet by mouth 2 (two) times daily for 7 days. Take with food    Dispense:  14 tablet    Refill:  0

## 2021-06-27 ENCOUNTER — Telehealth: Payer: Self-pay | Admitting: Physician Assistant

## 2021-06-27 DIAGNOSIS — B9789 Other viral agents as the cause of diseases classified elsewhere: Secondary | ICD-10-CM

## 2021-06-27 DIAGNOSIS — J019 Acute sinusitis, unspecified: Secondary | ICD-10-CM

## 2021-06-28 MED ORDER — AMOXICILLIN-POT CLAVULANATE 875-125 MG PO TABS: 1.0000 | ORAL_TABLET | Freq: Two times a day (BID) | ORAL | 0 refills | Status: DC

## 2021-06-28 MED ORDER — IPRATROPIUM BROMIDE 0.03 % NA SOLN: 2.0000 | Freq: Two times a day (BID) | NASAL | 0 refills | Status: DC

## 2021-06-28 NOTE — Addendum Note (Signed)
Addended by: Roxy Horseman B on: 06/28/2021 09:25 AM   Modules accepted: Orders

## 2021-06-28 NOTE — Progress Notes (Signed)

## 2022-02-05 ENCOUNTER — Telehealth: Payer: Self-pay | Admitting: Family Medicine

## 2022-02-05 DIAGNOSIS — J029 Acute pharyngitis, unspecified: Secondary | ICD-10-CM

## 2022-02-05 MED ORDER — AMOXICILLIN 500 MG PO CAPS: 500.0000 mg | ORAL_CAPSULE | Freq: Two times a day (BID) | ORAL | 0 refills | Status: AC

## 2022-02-05 NOTE — Progress Notes (Signed)

## 2022-04-30 ENCOUNTER — Telehealth: Payer: Self-pay | Admitting: Physician Assistant

## 2022-04-30 DIAGNOSIS — B354 Tinea corporis: Secondary | ICD-10-CM

## 2022-04-30 MED ORDER — CLOTRIMAZOLE-BETAMETHASONE 1-0.05 % EX CREA: 1.0000 | TOPICAL_CREAM | Freq: Every day | CUTANEOUS | 0 refills | Status: DC

## 2022-04-30 MED ORDER — FLUCONAZOLE 150 MG PO TABS: 150.0000 mg | ORAL_TABLET | ORAL | 0 refills | Status: DC

## 2022-04-30 NOTE — Progress Notes (Signed)
I have spent 5 minutes in review of e-visit questionnaire, review and updating patient chart, medical decision making and response to patient.   Nayvie Lips Cody Deletha Jaffee, PA-C    

## 2022-04-30 NOTE — Progress Notes (Signed)
E Visit for Rash  We are sorry that you are not feeling well. Here is how we plan to help!  Based upon your presentation it appears you have a fungal infection.  I have prescribed: Diflucan to take once weekly for 2 weeks. I have also sent in Lotrisone cream to apply as directed.    HOME CARE:  Take cool showers and avoid direct sunlight. Apply cool compress or wet dressings. Take a bath in an oatmeal bath.  Sprinkle content of one Aveeno packet under running faucet with comfortably warm water.  Bathe for 15-20 minutes, 1-2 times daily.  Pat dry with a towel. Do not rub the rash. Use hydrocortisone cream. Take an antihistamine like Benadryl for widespread rashes that itch.  The adult dose of Benadryl is 25-50 mg by mouth 4 times daily. Caution:  This type of medication may cause sleepiness.  Do not drink alcohol, drive, or operate dangerous machinery while taking antihistamines.  Do not take these medications if you have prostate enlargement.  Read package instructions thoroughly on all medications that you take.  GET HELP RIGHT AWAY IF:  Symptoms don't go away after treatment. Severe itching that persists. If you rash spreads or swells. If you rash begins to smell. If it blisters and opens or develops a yellow-brown crust. You develop a fever. You have a sore throat. You become short of breath.  MAKE SURE YOU:  Understand these instructions. Will watch your condition. Will get help right away if you are not doing well or get worse.  Thank you for choosing an e-visit.  Your e-visit answers were reviewed by a board certified advanced clinical practitioner to complete your personal care plan. Depending upon the condition, your plan could have included both over the counter or prescription medications.  Please review your pharmacy choice. Make sure the pharmacy is open so you can pick up prescription now. If there is a problem, you may contact your provider through [employer]  and have the prescription routed to another pharmacy.  Your safety is important to [place]. If you have drug allergies check your prescription carefully.   For the next 24 hours you can use MyChart to ask questions about today's visit, request a non-urgent call back, or ask for a work or school excuse. You will get an email in the next two days asking about your experience. I hope that your e-visit has been valuable and will speed your recovery.

## 2022-08-23 ENCOUNTER — Telehealth: Payer: Self-pay | Admitting: Physician Assistant

## 2022-08-23 DIAGNOSIS — B9789 Other viral agents as the cause of diseases classified elsewhere: Secondary | ICD-10-CM

## 2022-08-23 DIAGNOSIS — J019 Acute sinusitis, unspecified: Secondary | ICD-10-CM

## 2022-08-23 MED ORDER — BENZONATATE 100 MG PO CAPS: 100.0000 mg | ORAL_CAPSULE | Freq: Three times a day (TID) | ORAL | 0 refills | Status: DC | PRN

## 2022-08-23 MED ORDER — FLUTICASONE PROPIONATE 50 MCG/ACT NA SUSP: 2.0000 | Freq: Every day | NASAL | 0 refills | Status: DC

## 2022-08-23 NOTE — Progress Notes (Signed)
E-Visit for Upper Respiratory Infection   We are sorry you are not feeling well.  Here is how we plan to help!  Based on what you have shared with me, it looks like you may have a viral upper respiratory infection.  Upper respiratory infections are caused by a large number of viruses; however, rhinovirus is the most common cause.   Symptoms vary from person to person, with common symptoms including sore throat, cough, fatigue or lack of energy and feeling of general discomfort.  A low-grade fever of up to 100.4 may present, but is often uncommon.  Symptoms vary however, and are closely related to a person's age or underlying illnesses.  The most common symptoms associated with an upper respiratory infection are nasal discharge or congestion, cough, sneezing, headache and pressure in the ears and face.  These symptoms usually persist for about 3 to 10 days, but can last up to 2 weeks.  It is important to know that upper respiratory infections do not cause serious illness or complications in most cases.    Upper respiratory infections can be transmitted from person to person, with the most common method of transmission being a person's hands.  The virus is able to live on the skin and can infect other persons for up to 2 hours after direct contact.  Also, these can be transmitted when someone coughs or sneezes; thus, it is important to cover the mouth to reduce this risk.  To keep the spread of the illness at bay, good hand hygiene is very important.  This is an infection that is most likely caused by a virus. There are no specific treatments other than to help you with the symptoms until the infection runs its course.  We are sorry you are not feeling well.  Here is how we plan to help!   For nasal congestion, you may use an oral decongestants such as Mucinex D or if you have glaucoma or high blood pressure use plain Mucinex.  Saline nasal spray or nasal drops can help and can safely be used as often as  needed for congestion.  For your congestion, I have prescribed Fluticasone nasal spray one spray in each nostril twice a day  If you do not have a history of heart disease, hypertension, diabetes or thyroid disease, prostate/bladder issues or glaucoma, you may also use Sudafed to treat nasal congestion.  It is highly recommended that you consult with a pharmacist or your primary care physician to ensure this medication is safe for you to take.     If you have a cough, you may use cough suppressants such as Delsym and Robitussin.  If you have glaucoma or high blood pressure, you can also use Coricidin HBP.   For cough I have prescribed for you A prescription cough medication called Tessalon Perles 100 mg. You may take 1-2 capsules every 8 hours as needed for cough  If you have a sore or scratchy throat, use a saltwater gargle-  to  teaspoon of salt dissolved in a 4-ounce to 8-ounce glass of warm water.  Gargle the solution for approximately 15-30 seconds and then spit.  It is important not to swallow the solution.  You can also use throat lozenges/cough drops and Chloraseptic spray to help with throat pain or discomfort.  Warm or cold liquids can also be helpful in relieving throat pain.  For headache, pain or general discomfort, you can use Ibuprofen or Tylenol as directed.   Some authorities believe   that zinc sprays or the use of Echinacea may shorten the course of your symptoms.  I would recommend a Covid 19 test as well as this has been prevalent in the community over the last 2 weeks.  HOME CARE Only take medications as instructed by your medical team. Be sure to drink plenty of fluids. Water is fine as well as fruit juices, sodas and electrolyte beverages. You may want to stay away from caffeine or alcohol. If you are nauseated, try taking small sips of liquids. How do you know if you are getting enough fluid? Your urine should be a pale yellow or almost colorless. Get rest. Taking a steamy  shower or using a humidifier may help nasal congestion and ease sore throat pain. You can place a towel over your head and breathe in the steam from hot water coming from a faucet. Using a saline nasal spray works much the same way. Cough drops, hard candies and sore throat lozenges may ease your cough. Avoid close contacts especially the very young and the elderly Cover your mouth if you cough or sneeze Always remember to wash your hands.   GET HELP RIGHT AWAY IF: You develop worsening fever. If your symptoms do not improve within 10 days You develop yellow or green discharge from your nose over 3 days. You have coughing fits You develop a severe head ache or visual changes. You develop shortness of breath, difficulty breathing or start having chest pain Your symptoms persist after you have completed your treatment plan  MAKE SURE YOU  Understand these instructions. Will watch your condition. Will get help right away if you are not doing well or get worse.  Thank you for choosing an e-visit.  Your e-visit answers were reviewed by a board certified advanced clinical practitioner to complete your personal care plan. Depending upon the condition, your plan could have included both over the counter or prescription medications.  Please review your pharmacy choice. Make sure the pharmacy is open so you can pick up prescription now. If there is a problem, you may contact your provider through [employer] and have the prescription routed to another pharmacy.  Your safety is important to [place]. If you have drug allergies check your prescription carefully.   For the next 24 hours you can use MyChart to ask questions about today's visit, request a non-urgent call back, or ask for a work or school excuse. You will get an email in the next two days asking about your experience. I hope that your e-visit has been valuable and will speed your recovery.   I have spent 5 minutes in review of e-visit  questionnaire, review and updating patient chart, medical decision making and response to patient.   Mar Daring, PA-C

## 2022-08-26 ENCOUNTER — Telehealth: Payer: Self-pay | Admitting: Emergency Medicine

## 2022-08-26 DIAGNOSIS — J329 Chronic sinusitis, unspecified: Secondary | ICD-10-CM

## 2022-08-26 MED ORDER — AMOXICILLIN-POT CLAVULANATE 875-125 MG PO TABS: 1.0000 | ORAL_TABLET | Freq: Two times a day (BID) | ORAL | 0 refills | Status: DC

## 2022-08-26 NOTE — Progress Notes (Signed)

## 2022-11-19 ENCOUNTER — Telehealth: Payer: Self-pay | Admitting: Physician Assistant

## 2022-11-19 DIAGNOSIS — M5441 Lumbago with sciatica, right side: Secondary | ICD-10-CM

## 2022-11-20 MED ORDER — NAPROXEN 500 MG PO TABS: 500.0000 mg | ORAL_TABLET | Freq: Two times a day (BID) | ORAL | 0 refills | Status: DC

## 2022-11-20 MED ORDER — CYCLOBENZAPRINE HCL 10 MG PO TABS: 5.0000 mg | ORAL_TABLET | Freq: Three times a day (TID) | ORAL | 0 refills | Status: DC | PRN

## 2022-11-20 NOTE — Progress Notes (Signed)

## 2022-12-23 ENCOUNTER — Telehealth: Payer: Medicaid Other | Admitting: Physician Assistant

## 2022-12-23 DIAGNOSIS — B9689 Other specified bacterial agents as the cause of diseases classified elsewhere: Secondary | ICD-10-CM

## 2022-12-23 DIAGNOSIS — J019 Acute sinusitis, unspecified: Secondary | ICD-10-CM | POA: Diagnosis not present

## 2022-12-23 MED ORDER — AMOXICILLIN-POT CLAVULANATE 875-125 MG PO TABS: 1.0000 | ORAL_TABLET | Freq: Two times a day (BID) | ORAL | 0 refills | Status: DC

## 2022-12-23 NOTE — Progress Notes (Signed)

## 2023-03-30 ENCOUNTER — Encounter (HOSPITAL_BASED_OUTPATIENT_CLINIC_OR_DEPARTMENT_OTHER): Payer: Self-pay

## 2023-03-30 ENCOUNTER — Emergency Department (HOSPITAL_BASED_OUTPATIENT_CLINIC_OR_DEPARTMENT_OTHER)
Admission: EM | Admit: 2023-03-30 | Discharge: 2023-03-31 | Disposition: A | Discharge status: Home / Self Care | Payer: Medicaid Other | Attending: Emergency Medicine | Admitting: Emergency Medicine

## 2023-03-30 ENCOUNTER — Other Ambulatory Visit: Payer: Self-pay

## 2023-03-30 DIAGNOSIS — I10 Essential (primary) hypertension: Secondary | ICD-10-CM | POA: Diagnosis not present

## 2023-03-30 DIAGNOSIS — Z79899 Other long term (current) drug therapy: Secondary | ICD-10-CM | POA: Insufficient documentation

## 2023-03-30 DIAGNOSIS — R519 Headache, unspecified: Secondary | ICD-10-CM | POA: Diagnosis present

## 2023-03-30 NOTE — ED Provider Notes (Signed)
[place] [facility] AT [facility]  Provider Note  CSN: [id number] Arrival date & time: 03/30/23 2314  History Chief Complaint  Patient presents with   Hypertension    Amy Keller is a 37 y.o. female with no known history of HTN here with elevated BP at home. She was doing a telehealth visit to refill her OCP and they requested she check her BP at home and found it was elevated. She went to [facility] and confirmed it. She denies any other symptoms including headache, blurry vision, CP, SOB, abd pain, N/V/D or leg swelling.    Home Medications Prior to Admission medications   Medication Sig Start Date End Date Taking? Authorizing Provider  amoxicillin-clavulanate (AUGMENTIN) 875-125 MG tablet Take 1 tablet by mouth 2 (two) times daily. 12/23/22   Margaretann Loveless, PA-C  clotrimazole-betamethasone (LOTRISONE) cream Apply 1 Application topically daily. 04/30/22   Waldon Merl, PA-C  cyclobenzaprine (FLEXERIL) 10 MG tablet Take 0.5-1 tablets (5-10 mg total) by mouth 3 (three) times daily as needed. 11/20/22   Margaretann Loveless, PA-C  diclofenac (VOLTAREN) 75 MG EC tablet Take 1 tablet (75 mg total) by mouth 2 (two) times daily. 05/17/20   Lenn Sink, DPM  fluticasone (FLONASE) 50 MCG/ACT nasal spray Place 2 sprays into both nostrils daily. 08/23/22   Margaretann Loveless, PA-C  naproxen (NAPROSYN) 500 MG tablet Take 1 tablet (500 mg total) by mouth 2 (two) times daily with a meal. 11/20/22   Margaretann Loveless, PA-C     Allergies    Patient has no known allergies.   Review of Systems   Review of Systems Please see HPI for pertinent positives and negatives  Physical Exam BP (!) 216/103 (BP Location: Right Arm)   Pulse 95   Temp 99.2 F (37.3 C) (Oral)   Resp 18   Ht 5\' 6"  (1.676 m)   Wt (!) 158.2 kg   LMP 03/22/2023 (Exact Date)   SpO2 100%   BMI 56.30 kg/m   Physical Exam Vitals and nursing note reviewed.  Constitutional:       Appearance: Normal appearance. She is obese.  HENT:     Head: Normocephalic and atraumatic.     Nose: Nose normal.     Mouth/Throat:     Mouth: Mucous membranes are moist.  Eyes:     Extraocular Movements: Extraocular movements intact.     Conjunctiva/sclera: Conjunctivae normal.  Cardiovascular:     Rate and Rhythm: Normal rate.  Pulmonary:     Effort: Pulmonary effort is normal.     Breath sounds: Normal breath sounds.  Abdominal:     General: Abdomen is flat.     Palpations: Abdomen is soft.     Tenderness: There is no abdominal tenderness.  Musculoskeletal:        General: No swelling. Normal range of motion.     Cervical back: Neck supple.     Right lower leg: No edema.     Left lower leg: No edema.  Skin:    General: Skin is warm and dry.  Neurological:     General: No focal deficit present.     Mental Status: She is alert.  Psychiatric:        Mood and Affect: Mood normal.     ED Results / Procedures / Treatments   EKG EKG Interpretation Date/Time:  Sunday March 30 2023 23:45:01 EDT Ventricular Rate:  90 PR Interval:  177 QRS Duration:  116 QT Interval:  371 QTC Calculation: 454 R Axis:   68  Text Interpretation: Sinus rhythm Incomplete right bundle branch block No old tracing to compare Confirmed by Susy Frizzle [phone]) on 03/30/2023 11:48:32 PM  Procedures Procedures  Medications Ordered in the ED Medications - No data to display  Initial Impression and Plan  Patient here with asymptomatic HTN. Has been borderline elevated in the past, but she does not check it regularly. Will check labs to eval signs of end organ damage. She otherwise is well appearing in no distress.   ED Course       MDM Rules/Calculators/A&P Medical Decision Making Amount and/or Complexity of Data Reviewed Labs: ordered.     Final Clinical Impression(s) / ED Diagnoses Final diagnoses:  None    Rx / DC Orders ED Discharge Orders     None

## 2023-03-30 NOTE — ED Triage Notes (Signed)
Pt reports she ate a salty steak this morning at 0930 and she checked her BP and it was 215 systolic. She checked her BP due to having a telehealth visit for a birth control refill today and they would not refill due to previous BP in July being 141/77. Pt went to [facility] and it was 201/111 at 7:12pm. Pt has no sx, no HA, no vision changes. Pt states "I feel fine".

## 2023-03-31 LAB — CBC WITH DIFFERENTIAL/PLATELET
Abs Immature Granulocytes: 0.02 K/uL (ref 0.00–0.07)
Basophils Absolute: 0 K/uL (ref 0.0–0.1)
Basophils Relative: 1 %
Eosinophils Absolute: 0.4 K/uL (ref 0.0–0.5)
Eosinophils Relative: 5 %
HCT: 39 % (ref 36.0–46.0)
Hemoglobin: 12.8 g/dL (ref 12.0–15.0)
Immature Granulocytes: 0 %
Lymphocytes Relative: 40 %
Lymphs Abs: 3 K/uL (ref 0.7–4.0)
MCH: 27.5 pg (ref 26.0–34.0)
MCHC: 32.8 g/dL (ref 30.0–36.0)
MCV: 83.9 fL (ref 80.0–100.0)
Monocytes Absolute: 0.6 K/uL (ref 0.1–1.0)
Monocytes Relative: 8 %
Neutro Abs: 3.6 K/uL (ref 1.7–7.7)
Neutrophils Relative %: 46 %
Platelets: 291 K/uL (ref 150–400)
RBC: 4.65 MIL/uL (ref 3.87–5.11)
RDW: 13.2 % (ref 11.5–15.5)
WBC: 7.6 K/uL (ref 4.0–10.5)
nRBC: 0 % (ref 0.0–0.2)

## 2023-03-31 LAB — URINALYSIS, ROUTINE W REFLEX MICROSCOPIC
Bilirubin Urine: NEGATIVE
Glucose, UA: NEGATIVE mg/dL
Hgb urine dipstick: NEGATIVE
Ketones, ur: NEGATIVE mg/dL
Leukocytes,Ua: NEGATIVE
Nitrite: NEGATIVE
Protein, ur: NEGATIVE mg/dL
Specific Gravity, Urine: 1.01 (ref 1.005–1.030)
pH: 6 (ref 5.0–8.0)

## 2023-03-31 MED ORDER — AMLODIPINE BESYLATE 10 MG PO TABS
10.0000 mg | ORAL_TABLET | Freq: Every day | ORAL | 2 refills | Status: DC
  Filled 2023-05-25: qty 30, 30d supply, fill #0

## 2023-03-31 MED ORDER — AMLODIPINE BESYLATE 5 MG PO TABS: 10.0000 mg | ORAL_TABLET | Freq: Once | ORAL | Status: DC

## 2023-04-16 ENCOUNTER — Ambulatory Visit: Payer: Medicaid Other | Admitting: Internal Medicine

## 2023-04-28 ENCOUNTER — Other Ambulatory Visit: Payer: Self-pay

## 2023-04-28 ENCOUNTER — Ambulatory Visit (INDEPENDENT_AMBULATORY_CARE_PROVIDER_SITE_OTHER): Payer: Medicaid Other | Admitting: Student

## 2023-04-28 VITALS — BP 148/92 | HR 86 | Temp 98.3°F | Resp 20 | Ht 67.0 in | Wt 350.2 lb

## 2023-04-28 DIAGNOSIS — B354 Tinea corporis: Secondary | ICD-10-CM | POA: Diagnosis not present

## 2023-04-28 DIAGNOSIS — I10 Essential (primary) hypertension: Secondary | ICD-10-CM | POA: Diagnosis present

## 2023-04-28 DIAGNOSIS — R634 Abnormal weight loss: Secondary | ICD-10-CM

## 2023-04-28 DIAGNOSIS — R7303 Prediabetes: Secondary | ICD-10-CM

## 2023-04-28 LAB — POCT GLYCOSYLATED HEMOGLOBIN (HGB A1C): Hemoglobin A1C: 5.5 % (ref 4.0–5.6)

## 2023-04-28 LAB — GLUCOSE, CAPILLARY: Glucose-Capillary: 87 mg/dL (ref 70–99)

## 2023-04-28 MED ORDER — CLOTRIMAZOLE-BETAMETHASONE 1-0.05 % EX CREA
1.0000 | TOPICAL_CREAM | Freq: Every day | CUTANEOUS | 0 refills | Status: DC
Start: 2023-04-28 — End: 2023-05-28

## 2023-04-28 MED ORDER — SEMAGLUTIDE-WEIGHT MANAGEMENT 0.25 MG/0.5ML ~~LOC~~ SOAJ
0.2500 mg | SUBCUTANEOUS | 0 refills | Status: AC
Start: 2023-04-28 — End: 2023-06-11
  Filled 2023-05-13: qty 2, 28d supply, fill #0

## 2023-04-28 MED ORDER — HYDROCHLOROTHIAZIDE 12.5 MG PO TABS
25.0000 mg | ORAL_TABLET | Freq: Every day | ORAL | 3 refills | Status: DC
Start: 2023-04-28 — End: 2023-05-28
  Filled 2023-05-19: qty 30, 15d supply, fill #0

## 2023-04-28 MED ORDER — SEMAGLUTIDE-WEIGHT MANAGEMENT 1.7 MG/0.75ML ~~LOC~~ SOAJ
1.7000 mg | SUBCUTANEOUS | 0 refills | Status: AC
Start: 2023-07-24 — End: 2023-08-21

## 2023-04-28 MED ORDER — SEMAGLUTIDE-WEIGHT MANAGEMENT 2.4 MG/0.75ML ~~LOC~~ SOAJ
2.4000 mg | SUBCUTANEOUS | 0 refills | Status: AC
Start: 2023-08-22 — End: 2023-09-19

## 2023-04-28 MED ORDER — SEMAGLUTIDE-WEIGHT MANAGEMENT 1 MG/0.5ML ~~LOC~~ SOAJ
1.0000 mg | SUBCUTANEOUS | 0 refills | Status: AC
Start: 2023-06-25 — End: 2023-07-23

## 2023-04-28 MED ORDER — SEMAGLUTIDE-WEIGHT MANAGEMENT 0.5 MG/0.5ML ~~LOC~~ SOAJ
0.5000 mg | SUBCUTANEOUS | 0 refills | Status: AC
Start: 2023-05-27 — End: 2023-06-24

## 2023-04-28 NOTE — Progress Notes (Addendum)
CC: BP F/U   HPI: [name] is a 37 y.o. female living with a history stated below and presents today for blood pressure follow-up. Please see problem based assessment and plan for additional details.  Past Medical History:  Diagnosis Date   Obesity    Seasonal allergies    Strep throat     Current Outpatient Medications on File Prior to Visit  Medication Sig Dispense Refill   amLODipine (NORVASC) 10 MG tablet Take 1 tablet (10 mg total) by mouth daily. 30 tablet 2   amoxicillin-clavulanate (AUGMENTIN) 875-125 MG tablet Take 1 tablet by mouth 2 (two) times daily. 14 tablet 0   cyclobenzaprine (FLEXERIL) 10 MG tablet Take 0.5-1 tablets (5-10 mg total) by mouth 3 (three) times daily as needed. 30 tablet 0   diclofenac (VOLTAREN) 75 MG EC tablet Take 1 tablet (75 mg total) by mouth 2 (two) times daily. 50 tablet 2   fluticasone (FLONASE) 50 MCG/ACT nasal spray Place 2 sprays into both nostrils daily. 16 g 0   naproxen (NAPROSYN) 500 MG tablet Take 1 tablet (500 mg total) by mouth 2 (two) times daily with a meal. 30 tablet 0   No current facility-administered medications on file prior to visit.    No family history on file.  Social History   Socioeconomic History   Marital status: Single    Spouse name: Not on file   Number of children: Not on file   Years of education: Not on file   Highest education level: Associate degree: occupational, Scientist, product/process development, or vocational program  Occupational History   Not on file  Tobacco Use   Smoking status: Never   Smokeless tobacco: Never  Vaping Use   Vaping status: Never Used  Substance and Sexual Activity   Alcohol use: No   Drug use: No   Sexual activity: Not on file  Other Topics Concern   Not on file  Social History Narrative   Not on file   Social Determinants of Health   Financial Resource Strain: Low Risk  (04/27/2023)   Overall Financial Resource Strain (CARDIA)    Difficulty of Paying Living Expenses: Not hard at  all  Food Insecurity: No Food Insecurity (04/27/2023)   Hunger Vital Sign    Worried About Running Out of Food in the Last Year: Never true    Ran Out of Food in the Last Year: Never true  Transportation Needs: No Transportation Needs (04/27/2023)   PRAPARE - Administrator, Civil Service (Medical): No    Lack of Transportation (Non-Medical): No  Physical Activity: Insufficiently Active (04/27/2023)   Exercise Vital Sign    Days of Exercise per Week: 1 day    Minutes of Exercise per Session: 10 min  Stress: No Stress Concern Present (04/27/2023)   [employer] of Occupational Health - Occupational Stress Questionnaire    Feeling of Stress : Only a little  Social Connections: Moderately Isolated (04/27/2023)   Social Connection and Isolation Panel [NHANES]    Frequency of Communication with Friends and Family: More than three times a week    Frequency of Social Gatherings with Friends and Family: Twice a week    Attends Religious Services: More than 4 times per year    Active Member of [employer] or Organizations: No    Attends Banker Meetings: Not on file    Marital Status: Never married  Intimate Partner Violence: Not At Risk (04/28/2023)   Humiliation, Afraid, Rape, and  Kick questionnaire    Fear of Current or Ex-Partner: No    Emotionally Abused: No    Physically Abused: No    Sexually Abused: No    Review of Systems: ROS negative except for what is noted on the assessment and plan.  Vitals:   04/28/23 1521 04/28/23 1616  BP: (!) 169/86 (!) 148/92  Pulse: 98 86  Resp: 20   Temp: 98.3 F (36.8 C)   TempSrc: Oral   SpO2: 100%   Weight: (!) 350 lb 3.2 oz (158.8 kg)   Height: 5\' 7"  (1.702 m)     Physical Exam: Constitutional: well-appearing in no acute distress HENT: normocephalic atraumatic, mucous membranes moist Eyes: conjunctiva non-erythematous Neck: supple Cardiovascular: regular rate and rhythm, no m/r/g Pulmonary/Chest: normal work of  breathing on room air, lungs clear to auscultation bilaterally Abdominal: soft, non-tender, non-distended MSK: normal bulk and tone; papular rash of left knee  Neurological: alert & oriented x 3, 5/5 strength in bilateral upper and lower extremities, normal gait Skin: warm and dry   Assessment & Plan:   Hypertension Patient presented as a follow-up from the ED.  She was found to have elevated blood pressure at the time.  She first noticed in July when she was trying to get a refill for her birth control.  At that time, her blood pressure was 144/77, and was noted to be too high for refill.  Later in the day, she rechecked her blood pressure at [facility] and was 203/111.  At the dentist last week, her blood pressure was 160/110.  Today, she also noted left ankle swelling.  She denies any chest pain, headache, pain difficulties, or other issues.  Because she is of childbearing age, we are holding off on initiating lisinopril at this time.  Left ankle swelling is likely secondary to her current amlodipine. - Continue amlodipine 10 mg daily - Begin hydrochlorothiazide 25 mg daily - Follow-up BMP to check potassium at next visit  Weight loss Patient has BMI 54.85.  As patient is on Medicaid, we feel she is a good candidate for [facility]. Patient has tried phentermine in the past. She will continue lifestyle changes such as dieting and exercising.  - Begin Wegovy  Tinea corporis Patient has history of rash on left knee that comes and goes. Patient notes recent outbreak that is similar to previous episodes.  -Continuing Lotrisone    Patient seen with Dr. Collier Flowers, MD  [facility] Internal Medicine, PGY-1 Date 05/13/2023 Time 10:30 AM

## 2023-04-28 NOTE — Patient Instructions (Addendum)
Thank you so much for coming to the clinic today!   For your hypertension, please continue taking your amlodipine. We have also started you on hydrochlorothiazide. Please let us know if you have any issues!    If you have any questions please feel free to the call the clinic at anytime at [phone]. It was a pleasure seeing you!  Best, Dr. Rayvon Char

## 2023-04-29 ENCOUNTER — Telehealth: Payer: Self-pay

## 2023-04-29 DIAGNOSIS — B354 Tinea corporis: Secondary | ICD-10-CM | POA: Insufficient documentation

## 2023-04-29 DIAGNOSIS — I10 Essential (primary) hypertension: Secondary | ICD-10-CM | POA: Insufficient documentation

## 2023-04-29 DIAGNOSIS — R634 Abnormal weight loss: Secondary | ICD-10-CM | POA: Insufficient documentation

## 2023-04-29 NOTE — Telephone Encounter (Signed)
ERROR

## 2023-04-29 NOTE — Telephone Encounter (Signed)
Prior Authorization for patient [facility] 0.25MG ) came through on cover my meds was submitted with last office notes awaiting approval or denial.  [id number]

## 2023-04-29 NOTE — Assessment & Plan Note (Addendum)
Patient has BMI 54.85.  As patient is on Medicaid, we feel she is a good candidate for [facility]. Patient has tried phentermine in the past. She will continue lifestyle changes such as dieting and exercising.  - Begin [phone]

## 2023-04-29 NOTE — Assessment & Plan Note (Signed)
Patient has history of rash on left knee that comes and goes. Patient notes recent outbreak that is similar to previous episodes.  -Continuing Lotrisone

## 2023-04-29 NOTE — Telephone Encounter (Addendum)
Received a fax from [employer], requesting additional information. The form has been completed by Dr.Ghiles and faxed back to [employer] along with last office notes @[phone]

## 2023-04-29 NOTE — Assessment & Plan Note (Signed)
Patient presented as a follow-up from the ED.  She was found to have elevated blood pressure at the time.  She first noticed in July when she was trying to get a refill for her birth control.  At that time, her blood pressure was 144/77, and was noted to be too high for refill.  Later in the day, she rechecked her blood pressure at [facility] and was 203/111.  At the dentist last week, her blood pressure was 160/110.  Today, she also noted left ankle swelling.  She denies any chest pain, headache, pain difficulties, or other issues.  Because she is of childbearing age, we are holding off on initiating lisinopril at this time.  Left ankle swelling is likely secondary to her current amlodipine. - Continue amlodipine 10 mg daily - Begin hydrochlorothiazide 25 mg daily - Follow-up BMP to check potassium at next visit

## 2023-05-05 ENCOUNTER — Ambulatory Visit: Payer: Medicaid Other | Admitting: Internal Medicine

## 2023-05-05 ENCOUNTER — Telehealth: Payer: Self-pay | Admitting: *Deleted

## 2023-05-05 NOTE — Telephone Encounter (Signed)
Spoke with Dr. Allena Katz about patient's blood pressure.  Patient to continue the Amlodipinr 10 mg daily and to take 1 tablet of the hydrochlorothiazide 12.5 mg daily instead of 2.  Patient voiced understanding of the plan and will take 1 12.5 mg tablet of the hydrochlorothiazide and 1 of the Amlodipine tablets daily.  Was unable to get Semagutide filled needs a PA  or change in medications.

## 2023-05-05 NOTE — Telephone Encounter (Signed)
Call from patient states new B/P meds are making her groggy and tired.  Patient slao said that she took a B/P and it was 96/72.  Wants to know if she can cut her doses of the B/P meds.

## 2023-05-06 ENCOUNTER — Other Ambulatory Visit: Payer: Self-pay | Admitting: Student

## 2023-05-06 ENCOUNTER — Encounter: Payer: Self-pay | Admitting: Student

## 2023-05-06 NOTE — Telephone Encounter (Signed)
Decision:Denied Amy Keller ([name]) - [id number] KGURKY 0.25MG /0.5ML auto-injectors status: PA Response - DeniedCreated: September 9th, 2024 [phone] Sent: September 10th, 2024 Open  Archive

## 2023-05-13 ENCOUNTER — Other Ambulatory Visit (HOSPITAL_BASED_OUTPATIENT_CLINIC_OR_DEPARTMENT_OTHER): Payer: Self-pay

## 2023-05-13 NOTE — Telephone Encounter (Signed)
Called and spoke with the patients insurance regarding the denial, per [employer] they needed supporting documents listing the patients lifestyle changes/ plan of care along with failed medication. Prior Authorization has been resubmitted and office notes has been faxed back [phone]

## 2023-05-13 NOTE — Progress Notes (Signed)
Internal Medicine Clinic Attending  I was physically present during the key portions of the resident provided service and participated in the medical decision making of patient's management care. I reviewed pertinent patient test results.  The assessment, diagnosis, and plan were formulated together and I agree with the documentation in the resident's note.  Gust Rung, DO

## 2023-05-13 NOTE — Telephone Encounter (Signed)
Decision:Approved Effective 05/13/2023-11/10/2023 Patient is aware

## 2023-05-14 ENCOUNTER — Other Ambulatory Visit (HOSPITAL_BASED_OUTPATIENT_CLINIC_OR_DEPARTMENT_OTHER): Payer: Self-pay

## 2023-05-16 ENCOUNTER — Other Ambulatory Visit (HOSPITAL_BASED_OUTPATIENT_CLINIC_OR_DEPARTMENT_OTHER): Payer: Self-pay

## 2023-05-16 MED ORDER — SEMAGLUTIDE-WEIGHT MANAGEMENT 1 MG/0.5ML ~~LOC~~ SOAJ
1.0000 mg | SUBCUTANEOUS | 0 refills | Status: DC
  Filled 2023-06-21: qty 2, 28d supply, fill #0

## 2023-05-16 MED ORDER — SEMAGLUTIDE-WEIGHT MANAGEMENT 1.7 MG/0.75ML ~~LOC~~ SOAJ
1.7000 mg | SUBCUTANEOUS | 0 refills | Status: DC
  Filled 2023-07-23: qty 3, 28d supply, fill #0

## 2023-05-16 MED ORDER — SEMAGLUTIDE-WEIGHT MANAGEMENT 0.5 MG/0.5ML ~~LOC~~ SOAJ
0.5000 mg | SUBCUTANEOUS | 0 refills | Status: DC
  Filled 2023-05-28: qty 2, 28d supply, fill #0

## 2023-05-16 MED ORDER — SEMAGLUTIDE-WEIGHT MANAGEMENT 2.4 MG/0.75ML ~~LOC~~ SOAJ
2.4000 mg | SUBCUTANEOUS | 0 refills | Status: DC
  Filled 2023-08-21: qty 3, 28d supply, fill #0

## 2023-05-20 ENCOUNTER — Other Ambulatory Visit (HOSPITAL_BASED_OUTPATIENT_CLINIC_OR_DEPARTMENT_OTHER): Payer: Self-pay

## 2023-05-20 ENCOUNTER — Other Ambulatory Visit: Payer: Self-pay

## 2023-05-25 ENCOUNTER — Other Ambulatory Visit (HOSPITAL_BASED_OUTPATIENT_CLINIC_OR_DEPARTMENT_OTHER): Payer: Self-pay

## 2023-05-28 ENCOUNTER — Ambulatory Visit: Payer: Medicaid Other | Admitting: Student

## 2023-05-28 ENCOUNTER — Other Ambulatory Visit (HOSPITAL_BASED_OUTPATIENT_CLINIC_OR_DEPARTMENT_OTHER): Payer: Self-pay

## 2023-05-28 VITALS — BP 129/75 | HR 85 | Temp 98.6°F | Ht 67.0 in | Wt 346.0 lb

## 2023-05-28 DIAGNOSIS — Z0000 Encounter for general adult medical examination without abnormal findings: Secondary | ICD-10-CM | POA: Diagnosis not present

## 2023-05-28 DIAGNOSIS — L910 Hypertrophic scar: Secondary | ICD-10-CM | POA: Diagnosis present

## 2023-05-28 DIAGNOSIS — I10 Essential (primary) hypertension: Secondary | ICD-10-CM | POA: Diagnosis not present

## 2023-05-28 MED ORDER — HYDROCHLOROTHIAZIDE 12.5 MG PO TABS
12.5000 mg | ORAL_TABLET | Freq: Every day | ORAL | Status: DC
Start: 2023-05-28 — End: 2023-06-25

## 2023-05-28 NOTE — Assessment & Plan Note (Signed)
Patient currently on amlodipine 10, HCTZ 12.5.  She was prescribed HCTZ 25, but felt "woozy "on this medication.  She dropped her dose down to 12.5 with resolution of these effects.  Blood pressure within normal limits today, feels well on her current regiment. Plan: BMP today in setting of diuretic use

## 2023-05-28 NOTE — Patient Instructions (Signed)
Thank you, Ms.Aijalon ISHANVI MCQUITTY for allowing [place] to provide your care today.  I have ordered the following tests for you:   Lab Orders         BMP8+Anion Gap       Referrals ordered today:   Referral Orders  No referral(s) requested today     I have ordered the following medication/changed the following medications:   Stop the following medications: Medications Discontinued During This Encounter  Medication Reason   amoxicillin-clavulanate (AUGMENTIN) 875-125 MG tablet    diclofenac (VOLTAREN) 75 MG EC tablet    fluticasone (FLONASE) 50 MCG/ACT nasal spray    clotrimazole-betamethasone (LOTRISONE) cream    cyclobenzaprine (FLEXERIL) 10 MG tablet    naproxen (NAPROSYN) 500 MG tablet    hydrochlorothiazide (HYDRODIURIL) 12.5 MG tablet      Start the following medications: Meds ordered this encounter  Medications   hydrochlorothiazide (HYDRODIURIL) 12.5 MG tablet    Sig: Take 1 tablet (12.5 mg total) by mouth daily.       Follow up:  As need or one year  for pap smear with a female provider   We look forward to seeing you next time. Please call our clinic at [phone] if you have any questions or concerns. The best time to call is Monday-Friday from 9am-4pm, but there is someone available 24/7. If after hours or the weekend, call the main hospital number and ask for the Internal Medicine Resident On-Call. If you need medication refills, please notify your pharmacy one week in advance and they will send [place] a request.   Thank you for trusting me with your care. Wishing you the best!  Lovie Macadamia MD [facility] Internal Medicine Center

## 2023-05-28 NOTE — Assessment & Plan Note (Signed)
Patient has never had a Pap smear before, overdue.  Discussed the importance of Pap smear.  She will begin mammograms next year, and colonoscopy at age 37. Plan: Patient to call the office for return visit with female provider for Pap smear per patient preference.

## 2023-05-28 NOTE — Progress Notes (Signed)
Subjective:  CC: Blood pressure follow-up  HPI:  [name] is a 37 y.o. person with a past medical history stated below and presents today for blood pressure follow-up. Please see problem based assessment and plan for additional details.  Past Medical History:  Diagnosis Date   Obesity    Seasonal allergies    Strep throat     Current Outpatient Medications on File Prior to Visit  Medication Sig Dispense Refill   amLODipine (NORVASC) 10 MG tablet Take 1 tablet (10 mg total) by mouth daily. 30 tablet 2   Semaglutide-Weight Management 0.25 MG/0.5ML SOAJ Inject 0.25 mg into the skin once a week for 28 days. 2 mL 0   Semaglutide-Weight Management 0.5 MG/0.5ML SOAJ Inject 0.5 mg into the skin once a week for 28 days. 2 mL 0   Semaglutide-Weight Management 0.5 MG/0.5ML SOAJ Inject 0.5 mg into the skin once a week. 2 mL 0   [START ON 06/25/2023] Semaglutide-Weight Management 1 MG/0.5ML SOAJ Inject 1 mg into the skin once a week for 28 days. 2 mL 0   Semaglutide-Weight Management 1 MG/0.5ML SOAJ Inject 1 mg into the skin once a week. 2 mL 0   [START ON 07/24/2023] Semaglutide-Weight Management 1.7 MG/0.75ML SOAJ Inject 1.7 mg into the skin once a week for 28 days. 3 mL 0   Semaglutide-Weight Management 1.7 MG/0.75ML SOAJ Inject 1.7 mg into the skin once a week. 3 mL 0   [START ON 08/22/2023] Semaglutide-Weight Management 2.4 MG/0.75ML SOAJ Inject 2.4 mg into the skin once a week for 28 days. 3 mL 0   Semaglutide-Weight Management 2.4 MG/0.75ML SOAJ Inject 2.4 mg into the skin once a week. 3 mL 0   No current facility-administered medications on file prior to visit.    Review of Systems: Please see assessment and plan for pertinent positives and negatives.  Objective:   Vitals:   05/28/23 1541  BP: 129/75  Pulse: 85  Temp: 98.6 F (37 C)  TempSrc: Oral  SpO2: 100%  Weight: (!) 346 lb (156.9 kg)  Height: 5\' 7"  (1.702 m)    Physical Exam: Constitutional:  Well-appearing, in no acute distress Cardiovascular: regular rate and rhythm, no m/r/g Pulmonary/Chest: normal work of breathing on room air, lungs clear to auscultation bilaterally Abdominal: soft, non-tender, non-distended Extremities: No edema of the lower extremities bilaterally Skin: warm and dry, fibrous lesion consistent with keloid scar of the right ear. Psych: Pleasant mood and affect   Assessment & Plan:  Hypertension Patient currently on amlodipine 10, HCTZ 12.5.  She was prescribed HCTZ 25, but felt "woozy "on this medication.  She dropped her dose down to 12.5 with resolution of these effects.  Blood pressure within normal limits today, feels well on her current regiment. Plan: BMP today in setting of diuretic use   Keloid scar Patient has a fibrous nodule adjacent to her ear piercing on the right ear.  She says she has made see salt paste and put on this in the past which helped shrink the lesion.  She tried this recently without success.  On exam of the lesion is relatively firm and fibrous.  No signs of infection, likely a keloid scar.  Discussed with the patient that we can refer her to dermatology if she would like.  Healthcare maintenance Patient has never had a Pap smear before, overdue.  Discussed the importance of Pap smear.  She will begin mammograms next year, and colonoscopy at age 87. Plan: Patient to  call the office for return visit with female provider for Pap smear per patient preference.    Patient seen with Dr. Lajean Silvius MD [facility] Internal Medicine  PGY-1 Pager: [phone]  Phone: [phone] Date 05/28/2023  Time 6:04 PM

## 2023-05-28 NOTE — Assessment & Plan Note (Signed)
Patient has a fibrous nodule adjacent to her ear piercing on the right ear.  She says she has made see salt paste and put on this in the past which helped shrink the lesion.  She tried this recently without success.  On exam of the lesion is relatively firm and fibrous.  No signs of infection, likely a keloid scar.  Discussed with the patient that we can refer her to dermatology if she would like.

## 2023-05-29 ENCOUNTER — Other Ambulatory Visit (HOSPITAL_BASED_OUTPATIENT_CLINIC_OR_DEPARTMENT_OTHER): Payer: Self-pay

## 2023-05-30 LAB — BMP8+ANION GAP
Anion Gap: 18 mmol/L (ref 10.0–18.0)
BUN/Creatinine Ratio: 16 (ref 9–23)
BUN: 11 mg/dL (ref 6–20)
CO2: 25 mmol/L (ref 20–29)
Calcium: 9.8 mg/dL (ref 8.7–10.2)
Chloride: 98 mmol/L (ref 96–106)
Creatinine, Ser: 0.69 mg/dL (ref 0.57–1.00)
Glucose: 81 mg/dL (ref 70–99)
Potassium: 3.4 mmol/L — ABNORMAL LOW (ref 3.5–5.2)
Sodium: 141 mmol/L (ref 134–144)
eGFR: 113 mL/min/{1.73_m2} (ref >59)

## 2023-06-10 NOTE — Progress Notes (Signed)
Internal Medicine Clinic Attending  I was physically present during the key portions of the resident provided service and participated in the medical decision making of patient's management care. I reviewed pertinent patient test results.  The assessment, diagnosis, and plan were formulated together and I agree with the documentation in the resident's note.  Right ear with well defined nodular tissue at site of previous ear piercing on the auricle.  Likely keloid vs. Reactive tissue from the piercing.  Appears benign, non tender today.  Will monitor.  Dermatology if desired for removal.   Inez Catalina, MD

## 2023-06-21 ENCOUNTER — Other Ambulatory Visit (HOSPITAL_BASED_OUTPATIENT_CLINIC_OR_DEPARTMENT_OTHER): Payer: Self-pay

## 2023-06-21 ENCOUNTER — Other Ambulatory Visit (HOSPITAL_BASED_OUTPATIENT_CLINIC_OR_DEPARTMENT_OTHER): Payer: Self-pay | Admitting: Emergency Medicine

## 2023-06-23 ENCOUNTER — Other Ambulatory Visit: Payer: Self-pay

## 2023-06-24 ENCOUNTER — Other Ambulatory Visit: Payer: Self-pay

## 2023-06-25 ENCOUNTER — Ambulatory Visit: Payer: Medicaid Other | Admitting: Internal Medicine

## 2023-06-25 ENCOUNTER — Other Ambulatory Visit (HOSPITAL_COMMUNITY): Payer: Self-pay

## 2023-06-25 ENCOUNTER — Other Ambulatory Visit (HOSPITAL_COMMUNITY)
Admission: RE | Admit: 2023-06-25 | Discharge: 2023-06-25 | Disposition: A | Discharge status: Home / Self Care | Payer: Medicaid Other | Source: Ambulatory Visit | Attending: Internal Medicine | Admitting: Internal Medicine

## 2023-06-25 ENCOUNTER — Encounter: Payer: Self-pay | Admitting: Internal Medicine

## 2023-06-25 VITALS — BP 134/65 | HR 84 | Temp 98.7°F | Wt 338.2 lb

## 2023-06-25 DIAGNOSIS — Z124 Encounter for screening for malignant neoplasm of cervix: Secondary | ICD-10-CM

## 2023-06-25 DIAGNOSIS — I10 Essential (primary) hypertension: Secondary | ICD-10-CM

## 2023-06-25 DIAGNOSIS — R634 Abnormal weight loss: Secondary | ICD-10-CM

## 2023-06-25 DIAGNOSIS — Z0000 Encounter for general adult medical examination without abnormal findings: Secondary | ICD-10-CM

## 2023-06-25 MED ORDER — HYDROCHLOROTHIAZIDE 12.5 MG PO TABS
12.5000 mg | ORAL_TABLET | Freq: Every day | ORAL | 3 refills | Status: DC
  Filled 2023-06-25 – 2023-06-27 (×2): qty 90, 90d supply, fill #0
  Filled 2023-09-29: qty 90, 90d supply, fill #1
  Filled 2023-12-24: qty 90, 90d supply, fill #2
  Filled 2024-03-29: qty 90, 90d supply, fill #3

## 2023-06-25 NOTE — Patient Instructions (Addendum)
Thank you, [name] for allowing [place] to provide your care today.   Cervical cancer screening I will call with results from this.  This is contact information for healthy weight, keep up the good work. [facility] at [facility] service in [place] Address: [address] [place], [place], [place] [address] Phone: [phone]  I sent in refill for blood pressure medication.  I have ordered the following medication/changed the following medications:   Stop the following medications: Medications Discontinued During This Encounter  Medication Reason   hydrochlorothiazide (HYDRODIURIL) 12.5 MG tablet      Start the following medications: Meds ordered this encounter  Medications   hydrochlorothiazide (HYDRODIURIL) 12.5 MG tablet    Sig: Take 1 tablet (12.5 mg total) by mouth daily.    Dispense:  90 tablet    Refill:  3     Follow up:  5 months  We look forward to seeing you next time. Please call our clinic at [phone] if you have any questions or concerns. The best time to call is Monday-Friday from 9am-4pm, but there is someone available 24/7. If after hours or the weekend, call the main hospital number and ask for the Internal Medicine Resident On-Call. If you need medication refills, please notify your pharmacy one week in advance and they will send [place] a request.   Thank you for trusting me with your care. Wishing you the best!   Rudene Christians, DO [facility]

## 2023-06-25 NOTE — Progress Notes (Unsigned)
Subjective:  CC: pap smear  HPI:  Amy Keller is a 37 y.o. female with a past medical history stated below and presents today for cervical cancer screening.   Please see problem based assessment and plan for additional details.  Past Medical History:  Diagnosis Date   Obesity    Seasonal allergies     Current Outpatient Medications on File Prior to Visit  Medication Sig Dispense Refill   amLODipine (NORVASC) 10 MG tablet Take 1 tablet (10 mg total) by mouth daily. 30 tablet 2   Semaglutide-Weight Management 0.5 MG/0.5ML SOAJ Inject 0.5 mg into the skin once a week. 2 mL 0   Semaglutide-Weight Management 1 MG/0.5ML SOAJ Inject 1 mg into the skin once a week for 28 days. 2 mL 0   Semaglutide-Weight Management 1 MG/0.5ML SOAJ Inject 1 mg into the skin once a week. 2 mL 0   [START ON 07/24/2023] Semaglutide-Weight Management 1.7 MG/0.75ML SOAJ Inject 1.7 mg into the skin once a week for 28 days. 3 mL 0   Semaglutide-Weight Management 1.7 MG/0.75ML SOAJ Inject 1.7 mg into the skin once a week. 3 mL 0   [START ON 08/22/2023] Semaglutide-Weight Management 2.4 MG/0.75ML SOAJ Inject 2.4 mg into the skin once a week for 28 days. 3 mL 0   Semaglutide-Weight Management 2.4 MG/0.75ML SOAJ Inject 2.4 mg into the skin once a week. 3 mL 0   No current facility-administered medications on file prior to visit.    No family history on file.  Social History   Socioeconomic History   Marital status: Single    Spouse name: Not on file   Number of children: Not on file   Years of education: Not on file   Highest education level: Associate degree: occupational, Scientist, product/process development, or vocational program  Occupational History   Not on file  Tobacco Use   Smoking status: Never   Smokeless tobacco: Never  Vaping Use   Vaping status: Never Used  Substance and Sexual Activity   Alcohol use: No   Drug use: No   Sexual activity: Not on file  Other Topics Concern   Not on file  Social History  Narrative   Not on file   Social Determinants of Health   Financial Resource Strain: Low Risk  (04/27/2023)   Overall Financial Resource Strain (CARDIA)    Difficulty of Paying Living Expenses: Not hard at all  Food Insecurity: No Food Insecurity (04/27/2023)   Hunger Vital Sign    Worried About Running Out of Food in the Last Year: Never true    Ran Out of Food in the Last Year: Never true  Transportation Needs: No Transportation Needs (04/27/2023)   PRAPARE - Administrator, Civil Service (Medical): No    Lack of Transportation (Non-Medical): No  Physical Activity: Insufficiently Active (04/27/2023)   Exercise Vital Sign    Days of Exercise per Week: 1 day    Minutes of Exercise per Session: 10 min  Stress: No Stress Concern Present (04/27/2023)   [employer] of Occupational Health - Occupational Stress Questionnaire    Feeling of Stress : Only a little  Social Connections: Moderately Isolated (04/27/2023)   Social Connection and Isolation Panel [NHANES]    Frequency of Communication with Friends and Family: More than three times a week    Frequency of Social Gatherings with Friends and Family: Twice a week    Attends Religious Services: More than 4 times per year  Active Member of Clubs or Organizations: No    Attends Banker Meetings: Not on file    Marital Status: Never married  Intimate Partner Violence: Not At Risk (04/28/2023)   Humiliation, Afraid, Rape, and Kick questionnaire    Fear of Current or Ex-Partner: No    Emotionally Abused: No    Physically Abused: No    Sexually Abused: No    Review of Systems: ROS negative except for what is noted on the assessment and plan.  Objective:   Vitals:   06/25/23 1441  BP: 134/65  Pulse: 84  Temp: 98.7 F (37.1 C)  TempSrc: Oral  SpO2: 100%  Weight: (!) 338 lb 3.2 oz (153.4 kg)    Physical Exam: Constitutional: well-appearing  Cardiovascular: regular rate and rhythm, no  m/r/g Pulmonary/Chest: normal work of breathing on room air, lungs clear to auscultation bilaterally MSK: normal bulk and tone Skin: warm and dry GU: Normal appearing external gentilia, normal appearing vaginal mucosa and cervix.  no discharge or blood noted in vaginal vault.  Pap smear obtained.  Female nursing staff present during patient's pelvic examination  Assessment & Plan:  Weight loss She has lost about 15lbs in last 3 months. She is tolerating semaglutide well. Has not been to weight managemnt clinic yet but would be interested in going. -continue self titration with semaglutide -AVS contact info given for weight management clinic  Healthcare maintenance Cervical cancer screening completed today. She has never had this in past. Is sexually active with same partner for last 5 years and declines STD testing. She stopped birth control about 1 month ago with concerns about hypertension. She has heavy periods for her entire life. No fatigue or shortness of breath. Last CBC without anemia or low MCV. We talked about IUD vs nexplanon to help with period symptoms and she will think about this. P: Pap with cotesting collected    Patient discussed with Dr. Cleda Daub   Cordie Buening, D.O. [facility] Internal Medicine  PGY-3 Pager: [phone]  Phone: [phone] Date 06/26/2023  Time 5:11 PM

## 2023-06-26 ENCOUNTER — Encounter: Payer: Self-pay | Admitting: Internal Medicine

## 2023-06-26 NOTE — Assessment & Plan Note (Signed)
She has lost about 15lbs in last 3 months. She is tolerating semaglutide well. Has not been to weight managemnt clinic yet but would be interested in going. -continue self titration with semaglutide -AVS contact info given for weight management clinic

## 2023-06-26 NOTE — Assessment & Plan Note (Signed)
Cervical cancer screening completed today. She has never had this in past. Is sexually active with same partner for last 5 years and declines STD testing. She stopped birth control about 1 month ago with concerns about hypertension. She has heavy periods for her entire life. No fatigue or shortness of breath. Last CBC without anemia or low MCV. We talked about IUD vs nexplanon to help with period symptoms and she will think about this. P: Pap with cotesting collected

## 2023-06-27 ENCOUNTER — Other Ambulatory Visit (HOSPITAL_COMMUNITY): Payer: Self-pay

## 2023-06-27 ENCOUNTER — Other Ambulatory Visit (HOSPITAL_BASED_OUTPATIENT_CLINIC_OR_DEPARTMENT_OTHER): Payer: Self-pay

## 2023-06-30 ENCOUNTER — Other Ambulatory Visit: Payer: Self-pay | Admitting: Student

## 2023-06-30 ENCOUNTER — Other Ambulatory Visit (HOSPITAL_BASED_OUTPATIENT_CLINIC_OR_DEPARTMENT_OTHER): Payer: Self-pay

## 2023-07-01 LAB — CYTOLOGY - PAP
Comment: NEGATIVE
Comment: NEGATIVE
Comment: NEGATIVE
Diagnosis: NEGATIVE
HPV 16: NEGATIVE
HPV 18 / 45: NEGATIVE
High risk HPV: POSITIVE — AB

## 2023-07-03 ENCOUNTER — Other Ambulatory Visit (HOSPITAL_BASED_OUTPATIENT_CLINIC_OR_DEPARTMENT_OTHER): Payer: Self-pay

## 2023-07-03 MED ORDER — AMLODIPINE BESYLATE 10 MG PO TABS
10.0000 mg | ORAL_TABLET | Freq: Every day | ORAL | 2 refills | Status: DC
  Filled 2023-07-03: qty 30, 30d supply, fill #0
  Filled 2023-08-01: qty 30, 30d supply, fill #1
  Filled 2023-09-07: qty 30, 30d supply, fill #2

## 2023-07-03 NOTE — Progress Notes (Signed)
Internal Medicine Clinic Attending  Case discussed with the resident at the time of the visit.  We reviewed the resident's history and exam and pertinent patient test results.  I agree with the assessment, diagnosis, and plan of care documented in the resident's note.  

## 2023-07-24 ENCOUNTER — Other Ambulatory Visit (HOSPITAL_BASED_OUTPATIENT_CLINIC_OR_DEPARTMENT_OTHER): Payer: Self-pay

## 2023-08-01 ENCOUNTER — Other Ambulatory Visit (HOSPITAL_BASED_OUTPATIENT_CLINIC_OR_DEPARTMENT_OTHER): Payer: Self-pay

## 2023-08-21 ENCOUNTER — Other Ambulatory Visit (HOSPITAL_BASED_OUTPATIENT_CLINIC_OR_DEPARTMENT_OTHER): Payer: Self-pay

## 2023-09-25 ENCOUNTER — Other Ambulatory Visit (HOSPITAL_BASED_OUTPATIENT_CLINIC_OR_DEPARTMENT_OTHER): Payer: Self-pay

## 2023-09-25 MED ORDER — PROMETHAZINE-DM 6.25-15 MG/5ML PO SYRP
5.0000 mL | ORAL_SOLUTION | ORAL | 0 refills | Status: DC | PRN
  Filled 2023-09-25: qty 210, 7d supply, fill #0

## 2023-09-30 ENCOUNTER — Other Ambulatory Visit (HOSPITAL_BASED_OUTPATIENT_CLINIC_OR_DEPARTMENT_OTHER): Payer: Self-pay

## 2023-10-04 ENCOUNTER — Other Ambulatory Visit: Payer: Self-pay | Admitting: Student

## 2023-10-06 ENCOUNTER — Other Ambulatory Visit (HOSPITAL_BASED_OUTPATIENT_CLINIC_OR_DEPARTMENT_OTHER): Payer: Self-pay

## 2023-10-06 MED ORDER — AMLODIPINE BESYLATE 10 MG PO TABS
10.0000 mg | ORAL_TABLET | Freq: Every day | ORAL | 2 refills | Status: DC
  Filled 2023-10-06: qty 30, 30d supply, fill #0
  Filled 2023-11-05: qty 30, 30d supply, fill #1
  Filled 2023-12-10: qty 30, 30d supply, fill #2

## 2023-10-10 ENCOUNTER — Telehealth: Payer: Medicaid Other | Admitting: Physician Assistant

## 2023-10-10 ENCOUNTER — Other Ambulatory Visit (HOSPITAL_BASED_OUTPATIENT_CLINIC_OR_DEPARTMENT_OTHER): Payer: Self-pay

## 2023-10-10 DIAGNOSIS — J029 Acute pharyngitis, unspecified: Secondary | ICD-10-CM

## 2023-10-10 MED ORDER — AZITHROMYCIN 250 MG PO TABS
ORAL_TABLET | ORAL | 0 refills | Status: AC
  Filled 2023-10-10 – 2023-10-11 (×2): qty 6, 5d supply, fill #0

## 2023-10-10 NOTE — Progress Notes (Signed)

## 2023-10-11 ENCOUNTER — Other Ambulatory Visit: Payer: Self-pay | Admitting: Student

## 2023-10-11 ENCOUNTER — Other Ambulatory Visit (HOSPITAL_BASED_OUTPATIENT_CLINIC_OR_DEPARTMENT_OTHER): Payer: Self-pay

## 2023-10-13 ENCOUNTER — Other Ambulatory Visit (HOSPITAL_BASED_OUTPATIENT_CLINIC_OR_DEPARTMENT_OTHER): Payer: Self-pay

## 2023-10-13 MED FILL — Semaglutide (Weight Mngmt) Soln Auto-Injector 2.4 MG/0.75ML: SUBCUTANEOUS | 28 days supply | Qty: 3 | Fill #0 | Status: AC

## 2023-10-30 ENCOUNTER — Other Ambulatory Visit (HOSPITAL_BASED_OUTPATIENT_CLINIC_OR_DEPARTMENT_OTHER): Payer: Self-pay

## 2023-10-30 ENCOUNTER — Other Ambulatory Visit: Payer: Self-pay | Admitting: Student

## 2023-10-30 MED ORDER — WEGOVY 2.4 MG/0.75ML ~~LOC~~ SOAJ
2.4000 mg | SUBCUTANEOUS | 0 refills | Status: DC
  Filled 2023-10-30 – 2023-11-05 (×2): qty 3, 28d supply, fill #0

## 2023-11-05 ENCOUNTER — Other Ambulatory Visit: Payer: Self-pay

## 2023-11-05 ENCOUNTER — Other Ambulatory Visit (HOSPITAL_BASED_OUTPATIENT_CLINIC_OR_DEPARTMENT_OTHER): Payer: Self-pay

## 2023-11-05 ENCOUNTER — Telehealth: Admitting: Physician Assistant

## 2023-11-05 DIAGNOSIS — B9689 Other specified bacterial agents as the cause of diseases classified elsewhere: Secondary | ICD-10-CM

## 2023-11-05 DIAGNOSIS — J019 Acute sinusitis, unspecified: Secondary | ICD-10-CM

## 2023-11-05 MED ORDER — AMOXICILLIN-POT CLAVULANATE 875-125 MG PO TABS
1.0000 | ORAL_TABLET | Freq: Two times a day (BID) | ORAL | 0 refills | Status: DC
Start: 2023-11-05 — End: 2024-05-12
  Filled 2023-11-05: qty 14, 7d supply, fill #0

## 2023-11-05 NOTE — Progress Notes (Signed)

## 2023-11-05 NOTE — Progress Notes (Signed)
 I have spent 5 minutes in review of e-visit questionnaire, review and updating patient chart, medical decision making and response to patient.   Piedad Climes, PA-C

## 2023-11-11 ENCOUNTER — Other Ambulatory Visit (HOSPITAL_BASED_OUTPATIENT_CLINIC_OR_DEPARTMENT_OTHER): Payer: Self-pay

## 2023-11-19 ENCOUNTER — Ambulatory Visit: Admitting: Student

## 2023-11-19 ENCOUNTER — Other Ambulatory Visit (HOSPITAL_BASED_OUTPATIENT_CLINIC_OR_DEPARTMENT_OTHER): Payer: Self-pay

## 2023-11-19 VITALS — BP 126/55 | HR 81 | Temp 98.0°F | Ht 67.0 in | Wt 324.4 lb

## 2023-11-19 DIAGNOSIS — E66813 Obesity, class 3: Secondary | ICD-10-CM

## 2023-11-19 DIAGNOSIS — J302 Other seasonal allergic rhinitis: Secondary | ICD-10-CM

## 2023-11-19 DIAGNOSIS — G5601 Carpal tunnel syndrome, right upper limb: Secondary | ICD-10-CM

## 2023-11-19 DIAGNOSIS — Z6841 Body Mass Index (BMI) 40.0 and over, adult: Secondary | ICD-10-CM

## 2023-11-19 DIAGNOSIS — R634 Abnormal weight loss: Secondary | ICD-10-CM

## 2023-11-19 DIAGNOSIS — E669 Obesity, unspecified: Secondary | ICD-10-CM

## 2023-11-19 DIAGNOSIS — G5600 Carpal tunnel syndrome, unspecified upper limb: Secondary | ICD-10-CM | POA: Insufficient documentation

## 2023-11-19 MED ORDER — WEGOVY 2.4 MG/0.75ML ~~LOC~~ SOAJ
2.4000 mg | SUBCUTANEOUS | 0 refills | Status: DC
Start: 2023-11-19 — End: 2024-02-01
  Filled 2023-11-19 – 2024-01-09 (×7): qty 3, 28d supply, fill #0

## 2023-11-19 MED ORDER — FLUTICASONE PROPIONATE 50 MCG/ACT NA SUSP
2.0000 | Freq: Every day | NASAL | 3 refills | Status: AC
Start: 2023-11-19 — End: ?
  Filled 2023-11-19: qty 16, 30d supply, fill #0
  Filled 2023-12-24: qty 16, 25d supply, fill #0

## 2023-11-19 MED ORDER — OLOPATADINE HCL 0.1 % OP SOLN
1.0000 [drp] | Freq: Two times a day (BID) | OPHTHALMIC | 12 refills | Status: DC
  Filled 2023-11-19: qty 5, 50d supply, fill #0

## 2023-11-19 NOTE — Assessment & Plan Note (Signed)
 Patient notes rhinorrhea, epiphora, and ocular pruritus that has been ongoing for the past 6 weeks.  She notes that this occurs every year during the changing of the seasons.  She has tried over-the-counter antihistamines and nasal sprays.  Will trial Flonase with Pataday for continued support in conjunction with her Claritin. - Continue Claritin - Trial Flonase 2 sprays into her nostrils daily - Trial Pataday 1 drop into both eyes twice daily

## 2023-11-19 NOTE — Patient Instructions (Addendum)
 Thank you so much for coming to the clinic today!   For your hand numbness, you can purchase a carpal tunnel wrist splint at the store. Please begin wearing this.   For your allergies, I have sent in Flonase. Please continue to take the Claritin in conjunction with this medication. I have also sent in a prescription called olopatadine for your eyes. If your insurance does not cover this, you can find a coupon on GoodRx that will reduce the price.   If you have any questions please feel free to the call the clinic at anytime at [phone]. It was a pleasure seeing you!  Best, Dr. Rayvon Char

## 2023-11-19 NOTE — Assessment & Plan Note (Signed)
 Patient endorses numbness of her right second and third digits.  This numbness was reproduced with the Phalen test.  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  Will trial wrist splint at this time. - Begin wrist splint

## 2023-11-19 NOTE — Progress Notes (Addendum)
 CC: Allergies and hand numbness   HPI: [name] is a 38 y.o. female living with a history stated below and presents today for allergies and hand numbness. Please see problem based assessment and plan for additional details.  Past Medical History:  Diagnosis Date   Obesity    Seasonal allergies     Current Outpatient Medications on File Prior to Visit  Medication Sig Dispense Refill   amoxicillin -clavulanate (AUGMENTIN ) 875-125 MG tablet Take 1 tablet by mouth 2 (two) times daily. 14 tablet 0   hydrochlorothiazide  (HYDRODIURIL ) 12.5 MG tablet Take 1 tablet (12.5 mg total) by mouth daily. 90 tablet 3   promethazine -dextromethorphan (PROMETHAZINE -DM) 6.25-15 MG/5ML syrup Take 5 mLs by mouth every 4 (four) hours as needed for cough. 210 mL 0   Semaglutide -Weight Management 0.5 MG/0.5ML SOAJ Inject 0.5 mg into the skin once a week. 2 mL 0   Semaglutide -Weight Management 1 MG/0.5ML SOAJ Inject 1 mg into the skin once a week. 2 mL 0   Semaglutide -Weight Management 1.7 MG/0.75ML SOAJ Inject 1.7 mg into the skin once a week. 3 mL 0   No current facility-administered medications on file prior to visit.    No family history on file.  Social History   Socioeconomic History   Marital status: Single    Spouse name: Not on file   Number of children: Not on file   Years of education: Not on file   Highest education level: Associate degree: occupational, Scientist, product/process development, or vocational program  Occupational History   Not on file  Tobacco Use   Smoking status: Never   Smokeless tobacco: Never  Vaping Use   Vaping status: Never Used  Substance and Sexual Activity   Alcohol use: No   Drug use: No   Sexual activity: Not on file  Other Topics Concern   Not on file  Social History Narrative   Not on file   Social Drivers of Health   Financial Resource Strain: Low Risk  (04/27/2023)   Overall Financial Resource Strain (CARDIA)    Difficulty of Paying Living Expenses: Not hard at all   Food Insecurity: No Food Insecurity (04/27/2023)   Hunger Vital Sign    Worried About Running Out of Food in the Last Year: Never true    Ran Out of Food in the Last Year: Never true  Transportation Needs: No Transportation Needs (04/27/2023)   PRAPARE - Administrator, Civil Service (Medical): No    Lack of Transportation (Non-Medical): No  Physical Activity: Insufficiently Active (04/27/2023)   Exercise Vital Sign    Days of Exercise per Week: 1 day    Minutes of Exercise per Session: 10 min  Stress: No Stress Concern Present (04/27/2023)   [employer] of Occupational Health - Occupational Stress Questionnaire    Feeling of Stress : Only a little  Social Connections: Moderately Isolated (04/27/2023)   Social Connection and Isolation Panel [NHANES]    Frequency of Communication with Friends and Family: More than three times a week    Frequency of Social Gatherings with Friends and Family: Twice a week    Attends Religious Services: More than 4 times per year    Active Member of [employer] or Organizations: No    Attends Banker Meetings: Not on file    Marital Status: Never married  Intimate Partner Violence: Not At Risk (04/28/2023)   Humiliation, Afraid, Rape, and Kick questionnaire    Fear of Current or Ex-Partner: No  Emotionally Abused: No    Physically Abused: No    Sexually Abused: No    Review of Systems: ROS negative except for what is noted on the assessment and plan.  Vitals:   11/19/23 1407  BP: (!) 126/55  Pulse: 81  Temp: 98 F (36.7 C)  TempSrc: Oral  SpO2: 100%  Weight: (!) 324 lb 6.4 oz (147.1 kg)  Height: 5\' 7"  (1.702 m)   Physical Exam: Constitutional: well-appearing in no acute distress HENT: normocephalic atraumatic, mucous membranes moist Eyes: conjunctiva non-erythematous Neck: supple Cardiovascular: regular rate and rhythm, no m/r/g Pulmonary/Chest: normal work of breathing on room air, lungs clear to auscultation  bilaterally Abdominal: soft, non-tender, non-distended MSK: normal bulk and tone Neurological: alert & oriented x 3, 5/5 strength in bilateral upper and lower extremities, normal gait Skin: warm and dry  Assessment & Plan:   Seasonal allergies Patient notes rhinorrhea, epiphora, and ocular pruritus that has been ongoing for the past 6 weeks.  She notes that this occurs every year during the changing of the seasons.  She has tried over-the-counter antihistamines and nasal sprays.  Will trial Flonase  with Pataday  for continued support in conjunction with her Claritin. - Continue Claritin - Trial Flonase  2 sprays into her nostrils daily - Trial Pataday  1 drop into both eyes twice daily  Carpal tunnel syndrome Patient endorses numbness of her right second and third digits.  This numbness was reproduced with the Phalen test.  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  Will trial wrist splint at this time. - Begin wrist splint  OBESITY, NOS Patient does not currently have a condition preventing her from using Wegovy . She would benefit from the weight-management. Her baseline weight is 324 pounds with a BMI of 50.7. She hast tried to lose weight through healthy eating and regular exercise but has not lost a total of 5% of her pretreatment weight. She has been exercising regularly roughly 3-4 times per week through walking and other means. She has also maintained a well-balanced diet. She will continue to incorporate healthy dieting and exercise into her current regimen. She would benefit from this medication to help aid her weight management.   Patient discussed with Dr. Rafael Bun, MD  [facility] Internal Medicine, PGY-1 Date 01/08/2024 Time 2:14 PM

## 2023-11-29 NOTE — Progress Notes (Signed)
 Internal Medicine Clinic Attending  Case discussed with the resident at the time of the visit.  We reviewed the resident's history and exam and pertinent patient test results.  I agree with the assessment, diagnosis, and plan of care documented in the resident's note.

## 2023-12-01 ENCOUNTER — Other Ambulatory Visit (HOSPITAL_BASED_OUTPATIENT_CLINIC_OR_DEPARTMENT_OTHER): Payer: Self-pay

## 2023-12-04 ENCOUNTER — Other Ambulatory Visit (HOSPITAL_BASED_OUTPATIENT_CLINIC_OR_DEPARTMENT_OTHER): Payer: Self-pay

## 2023-12-04 ENCOUNTER — Telehealth: Payer: Self-pay

## 2023-12-04 NOTE — Telephone Encounter (Signed)
 Prior Authorization for patient Amy Keller 2.4MG /0.75ML auto-injectors) came through on cover my meds was submitted with last office notes awaiting approval or denial.  KEY:[id number]

## 2023-12-05 ENCOUNTER — Other Ambulatory Visit (HOSPITAL_BASED_OUTPATIENT_CLINIC_OR_DEPARTMENT_OTHER): Payer: Self-pay

## 2023-12-09 NOTE — Telephone Encounter (Addendum)
 YOU ASKED FOR:  Requested Requested Service Description Code 1 Code 2 Plan Dates Amount Wegovy  Inj 2.4mg  Medicaid 12/04/2023 WE DENIED: Denied Denied Code 1 Code 2 Plan Service Description Dates Amount Wegovy  Inj 2.4mg  Medicaid 12/04/2023 We denied your request for:  Wegovy  Inj 2.4mg   Medical Necessity Your provider did not send us  information we requested.  On 12/04/2023, we asked your provider for the following important facts or documents: Per your health plan's criteria, this drug is covered if you meet the following: (1) Your doctor submits medical records (for example, chart notes, laboratory values) of all of the following: (A) Baseline weight. (B) Baseline and current body fat amount (body mass index). (2) You are 26 years of age or older, and your doctor submits medical records (for example, chart notes, laboratory values) of one of the following: (I) You had a weight loss of more than or equal to 5% of your pretreatment weight and maintain the 5% weight loss. (II) You have not lost a total of 5% of pretreatment weight (but weight loss is deemed to be a significant reduction from baseline body mass index per submission of rationale by your doctor and the weight loss is maintained). (3) Your doctor submits medical records (for example, chart notes, assessments, treatment plans, monitoring plans) showing you are currently on and will continue lifestyle changes (including structured nutrition and physical activity, unless physical activity is not clinically appropriate at the time the requested therapy commences). (4) You do not have a condition that prevents you from using this drug (do not have any contraindications to the drug, including pregnancy, lactation, history of medullary thyroid cancer or multiple endocrine neoplasia type II). The information provided does not show that you meet the criteria listed above. Please speak with your doctor about your choices. This  decision was made per the [facility] [place]  Wegovy  Guideline.   The information provided above needs to be included in a office notes. I can resubmit the request once this has been done.

## 2023-12-10 ENCOUNTER — Other Ambulatory Visit (HOSPITAL_BASED_OUTPATIENT_CLINIC_OR_DEPARTMENT_OTHER): Payer: Self-pay

## 2023-12-22 ENCOUNTER — Telehealth: Payer: Self-pay | Admitting: *Deleted

## 2023-12-22 NOTE — Telephone Encounter (Unsigned)
 Copied from CRM [id number]. Topic: Clinical - Prescription Issue >> Dec 22, 2023  1:39 PM Blair Bumpers wrote: Reason for CRM: Patient called stating that her Wegovy  was denied by the insurance. She states the letter she received stated it was denied due to Dr. Carolee Churchman not responding back to insurance. Patient wants to know is there another medication that would be covered under her insurance that she could get & also to not have to continue to go through this. Please give patient a call back. CB #: [phone].

## 2023-12-24 ENCOUNTER — Other Ambulatory Visit (HOSPITAL_BASED_OUTPATIENT_CLINIC_OR_DEPARTMENT_OTHER): Payer: Self-pay

## 2023-12-24 ENCOUNTER — Other Ambulatory Visit: Payer: Self-pay | Admitting: Student

## 2023-12-24 MED ORDER — AMLODIPINE BESYLATE 10 MG PO TABS
10.0000 mg | ORAL_TABLET | Freq: Every day | ORAL | 3 refills | Status: DC
  Filled 2023-12-24 – 2024-01-09 (×2): qty 30, 30d supply, fill #0
  Filled 2024-02-12: qty 30, 30d supply, fill #1
  Filled 2024-03-09: qty 30, 30d supply, fill #2
  Filled 2024-04-07: qty 30, 30d supply, fill #3

## 2023-12-24 NOTE — Telephone Encounter (Signed)
 Medication refill request has already been addressed.

## 2023-12-24 NOTE — Telephone Encounter (Signed)
 Patient returned my call. I will update her when I receive any additional information to resubmit the PA.

## 2023-12-24 NOTE — Telephone Encounter (Signed)
 Please address.

## 2023-12-24 NOTE — Telephone Encounter (Signed)
 Attempted to call the patient to update her on the PA. Unable to reach the patient I left her a voicemail to give us  a call back.

## 2023-12-25 ENCOUNTER — Other Ambulatory Visit (HOSPITAL_BASED_OUTPATIENT_CLINIC_OR_DEPARTMENT_OTHER): Payer: Self-pay

## 2023-12-30 NOTE — Telephone Encounter (Signed)
 The information required below needs to be documented in Dr.Ghiles last note or in a chart encounter.

## 2024-01-05 ENCOUNTER — Other Ambulatory Visit (HOSPITAL_BASED_OUTPATIENT_CLINIC_OR_DEPARTMENT_OTHER): Payer: Self-pay

## 2024-01-05 ENCOUNTER — Telehealth: Payer: Self-pay | Admitting: *Deleted

## 2024-01-05 NOTE — Assessment & Plan Note (Addendum)
 Patient does not currently have a condition preventing her from using Wegovy . She would benefit from the weight-management. Her baseline weight is 324 pounds with a BMI of 50.7. She hast tried to lose weight through healthy eating and regular exercise but has not lost a total of 5% of her pretreatment weight. She has been exercising regularly roughly 3-4 times per week through walking and other means. She has also maintained a well-balanced diet. She will continue to incorporate healthy dieting and exercise into her current regimen. She would benefit from this medication to help aid her weight management.

## 2024-01-05 NOTE — Telephone Encounter (Signed)
 Will forward to Dr. Carolee Churchman. Copied from CRM [id number]. Topic: Clinical - Medication Question >> Jan 02, 2024  1:54 PM Lenon Radar A wrote: Reason for CRM: Patient called in for update on Wegovy  medication. Please provide update to patient. Patient can be reached at [phone].

## 2024-01-07 ENCOUNTER — Telehealth: Payer: Self-pay

## 2024-01-07 ENCOUNTER — Other Ambulatory Visit (HOSPITAL_BASED_OUTPATIENT_CLINIC_OR_DEPARTMENT_OTHER): Payer: Self-pay

## 2024-01-07 NOTE — Telephone Encounter (Signed)
 Prior Authorization for patient (Wegovy  2.4MG /0.75ML auto-injectors) came through on cover my meds was submitted with last office notes awaiting approval or denial.  ION:[id number]

## 2024-01-08 ENCOUNTER — Other Ambulatory Visit (HOSPITAL_COMMUNITY): Payer: Self-pay

## 2024-01-08 ENCOUNTER — Telehealth: Payer: Self-pay | Admitting: Student

## 2024-01-08 NOTE — Telephone Encounter (Signed)
 Faxed paper copy PA form for [facility] medicaid plan to [phone], with weight loss chart notes from 04/2023 and 11/2023

## 2024-01-08 NOTE — Telephone Encounter (Signed)
 Pharmacy Patient Advocate Encounter  Received notification from [facility] MEDICAID that Prior Authorization for WEGOVY  2.4MG  has been DENIED.  Full denial letter will be uploaded to the media tab. See denial reason below.    This would be a continuation of therapy for patient. Patient has lost 26 pounds since starting in 04/2023 (baseline weight 350lbs), however recent chart notes (11/19/23) will need to include documentation of patients diet and exercise, and mention that patient will continue these efforts.   This can be faxed to insurance once completed.   PA #/Case ID/Reference #: [id number]

## 2024-01-08 NOTE — Telephone Encounter (Signed)
 Copied from CRM [id number]. Topic: General - Other >> Jan 07, 2024  3:43 PM Adrianna P wrote: Reason for CRM: patient calling for update on prior authorization

## 2024-01-08 NOTE — Telephone Encounter (Addendum)
 Updated note has been faxed to the patients insurance @ [phone]

## 2024-01-08 NOTE — Telephone Encounter (Signed)
 PA has been resubmitted to cover my meds on 01/07/24 awaiting approval or denial. I called the patient to give her a update. Unable to reach her, I lvm.

## 2024-01-09 ENCOUNTER — Other Ambulatory Visit (HOSPITAL_BASED_OUTPATIENT_CLINIC_OR_DEPARTMENT_OTHER): Payer: Self-pay

## 2024-01-09 NOTE — Telephone Encounter (Signed)
 [employer] [place]  has reviewed the request for Wegovy  Inj 2.4 mg submitted by Amy Keller on behalf of Amy Keller on 01/08/2024. After review, the request service is: Approved through 01/07/2025.    I called the patient to let her know the status of the prior authorization. Unable to reach the patient I lvm with details. My chart message has been sent as well.

## 2024-01-27 ENCOUNTER — Encounter: Payer: Self-pay | Admitting: *Deleted

## 2024-02-01 ENCOUNTER — Other Ambulatory Visit: Payer: Self-pay | Admitting: Student

## 2024-02-01 DIAGNOSIS — R634 Abnormal weight loss: Secondary | ICD-10-CM

## 2024-02-05 ENCOUNTER — Other Ambulatory Visit (HOSPITAL_COMMUNITY): Payer: Self-pay

## 2024-02-05 ENCOUNTER — Other Ambulatory Visit (HOSPITAL_BASED_OUTPATIENT_CLINIC_OR_DEPARTMENT_OTHER): Payer: Self-pay

## 2024-02-06 ENCOUNTER — Other Ambulatory Visit (HOSPITAL_BASED_OUTPATIENT_CLINIC_OR_DEPARTMENT_OTHER): Payer: Self-pay

## 2024-02-06 MED ORDER — WEGOVY 2.4 MG/0.75ML ~~LOC~~ SOAJ
2.4000 mg | SUBCUTANEOUS | 0 refills | Status: DC
  Filled 2024-02-06: qty 3, 28d supply, fill #0

## 2024-02-17 ENCOUNTER — Telehealth: Payer: Self-pay | Admitting: *Deleted

## 2024-02-17 NOTE — Telephone Encounter (Unsigned)
 Copied from CRM [id number]. Topic: General - Other >> Feb 17, 2024  2:49 PM Miquel SAILOR wrote: Reason for CRM: Patient states need letter for Clearance for passing thru [employer] taking a trip on medicaitons she takes. Needs call back. Will be taking trip on 07/13. [phone]  Med List:  amLODipine  (NORVASC ) 10 MG tablet amoxicillin -clavulanate (AUGMENTIN ) 875-125 MG tablet fluticasone  (FLONASE ) 50 MCG/ACT nasal spray hydrochlorothiazide  (HYDRODIURIL ) 12.5 MG tablet olopatadine  (PATADAY ) 0.1 % ophthalmic solution promethazine -dextromethorphan (PROMETHAZINE -DM) 6.25-15 MG/5ML syrup Semaglutide -Weight Management (WEGOVY ) 2.4 MG/0.75ML SOAJ Semaglutide -Weight Management 0.5 MG/0.5ML SOAJ Semaglutide -Weight Management 1 MG/0.5ML SOAJ Semaglutide -Weight Management 1.7 MG/0.75ML SOAJ

## 2024-02-27 ENCOUNTER — Other Ambulatory Visit: Payer: Self-pay | Admitting: Student

## 2024-02-27 DIAGNOSIS — R634 Abnormal weight loss: Secondary | ICD-10-CM

## 2024-03-02 ENCOUNTER — Other Ambulatory Visit: Payer: Self-pay

## 2024-03-02 ENCOUNTER — Other Ambulatory Visit (HOSPITAL_BASED_OUTPATIENT_CLINIC_OR_DEPARTMENT_OTHER): Payer: Self-pay

## 2024-03-02 ENCOUNTER — Other Ambulatory Visit: Payer: Self-pay | Admitting: Student

## 2024-03-02 DIAGNOSIS — R634 Abnormal weight loss: Secondary | ICD-10-CM

## 2024-03-02 MED ORDER — WEGOVY 2.4 MG/0.75ML ~~LOC~~ SOAJ
2.4000 mg | SUBCUTANEOUS | 0 refills | Status: DC
Start: 2024-03-02 — End: 2024-03-15
  Filled 2024-03-02 – 2024-03-10 (×3): qty 3, 28d supply, fill #0

## 2024-03-04 ENCOUNTER — Other Ambulatory Visit (HOSPITAL_BASED_OUTPATIENT_CLINIC_OR_DEPARTMENT_OTHER): Payer: Self-pay

## 2024-03-09 ENCOUNTER — Other Ambulatory Visit (HOSPITAL_BASED_OUTPATIENT_CLINIC_OR_DEPARTMENT_OTHER): Payer: Self-pay

## 2024-03-09 ENCOUNTER — Other Ambulatory Visit: Payer: Self-pay

## 2024-03-10 ENCOUNTER — Other Ambulatory Visit (HOSPITAL_BASED_OUTPATIENT_CLINIC_OR_DEPARTMENT_OTHER): Payer: Self-pay

## 2024-03-15 ENCOUNTER — Other Ambulatory Visit (HOSPITAL_BASED_OUTPATIENT_CLINIC_OR_DEPARTMENT_OTHER): Payer: Self-pay

## 2024-03-15 ENCOUNTER — Telehealth: Payer: Self-pay | Admitting: *Deleted

## 2024-03-15 ENCOUNTER — Telehealth: Payer: Self-pay

## 2024-03-15 ENCOUNTER — Other Ambulatory Visit: Payer: Self-pay | Admitting: Student

## 2024-03-15 DIAGNOSIS — R634 Abnormal weight loss: Secondary | ICD-10-CM

## 2024-03-15 MED ORDER — WEGOVY 2.4 MG/0.75ML ~~LOC~~ SOAJ
2.4000 mg | SUBCUTANEOUS | 1 refills | Status: DC
Start: 2024-03-15 — End: 2024-05-04
  Filled 2024-03-15: qty 3, 28d supply, fill #0
  Filled 2024-03-29 – 2024-04-07 (×2): qty 3, 28d supply, fill #1

## 2024-03-15 NOTE — Telephone Encounter (Signed)
 Patient is aware

## 2024-03-15 NOTE — Telephone Encounter (Signed)
 Copied from CRM [id number]. Topic: Clinical - Prescription Issue >> Mar 12, 2024  2:46 PM Susanna ORN wrote: Reason for CRM: Patient called stating that she has been without her Semaglutide -Weight Management (WEGOVY ) 2.4 MG/0.75ML SOAJ for two weeks. States she spoke with the pharmacy and they told her that the provider is not covered by Medicaid. Patient also stated that the pharmacy has tried to reach out to clinic as well but got no response. Patient stated she was suppose to take her medication today at [facility]. >> Mar 15, 2024  8:54 AM Nurse Kenneth DEL wrote: Will forward to Celsey Asselin E, CMA for PA.

## 2024-03-15 NOTE — Telephone Encounter (Signed)
 Medication was last refilled by Dr.Azadegan on 03/02/24. Rx will need to be sent by a different provider. (Koomson,Juberg,Patel,[place]moako)

## 2024-03-29 ENCOUNTER — Other Ambulatory Visit (HOSPITAL_BASED_OUTPATIENT_CLINIC_OR_DEPARTMENT_OTHER): Payer: Self-pay

## 2024-03-29 ENCOUNTER — Other Ambulatory Visit: Payer: Self-pay

## 2024-05-03 ENCOUNTER — Other Ambulatory Visit: Payer: Self-pay | Admitting: Student

## 2024-05-03 ENCOUNTER — Other Ambulatory Visit: Payer: Self-pay

## 2024-05-03 ENCOUNTER — Other Ambulatory Visit (HOSPITAL_BASED_OUTPATIENT_CLINIC_OR_DEPARTMENT_OTHER): Payer: Self-pay

## 2024-05-03 DIAGNOSIS — R634 Abnormal weight loss: Secondary | ICD-10-CM

## 2024-05-03 DIAGNOSIS — I10 Essential (primary) hypertension: Secondary | ICD-10-CM

## 2024-05-03 MED ORDER — AMLODIPINE BESYLATE 10 MG PO TABS
10.0000 mg | ORAL_TABLET | Freq: Every day | ORAL | 3 refills | Status: DC
  Filled 2024-05-03 – 2024-05-14 (×2): qty 30, 30d supply, fill #0
  Filled 2024-06-13: qty 30, 30d supply, fill #1
  Filled 2024-07-09 – 2024-07-15 (×2): qty 30, 30d supply, fill #2
  Filled 2024-08-10: qty 30, 30d supply, fill #3

## 2024-05-03 MED ORDER — HYDROCHLOROTHIAZIDE 12.5 MG PO TABS
12.5000 mg | ORAL_TABLET | Freq: Every day | ORAL | 0 refills | Status: DC
  Filled 2024-05-03: qty 90, 90d supply, fill #0

## 2024-05-03 NOTE — Telephone Encounter (Unsigned)
 Copied from CRM [id number]. Topic: Clinical - Medication Refill >> May 03, 2024  2:54 PM Shamecia H wrote: Medication: amLODipine  (NORVASC ) 10 MG tablet, hydrochlorothiazide  (HYDRODIURIL ) 12.5 MG tablet  Has the patient contacted their pharmacy? Yes (Agent: If no, request that the patient contact the pharmacy for the refill. If patient does not wish to contact the pharmacy document the reason why and proceed with request.) (Agent: If yes, when and what did the pharmacy advise?)  This is the patient's preferred pharmacy:  [facility] [address] [place] [address] Phone: [phone] Fax: [phone]  Is this the correct pharmacy for this prescription? Yes If no, delete pharmacy and type the correct one.   Has the prescription been filled recently? No  Is the patient out of the medication? Yes  Has the patient been seen for an appointment in the last year OR does the patient have an upcoming appointment? Yes  Can we respond through MyChart? Yes  Agent: Please be advised that Rx refills may take up to 3 business days. We ask that you follow-up with your pharmacy.

## 2024-05-04 ENCOUNTER — Other Ambulatory Visit (HOSPITAL_BASED_OUTPATIENT_CLINIC_OR_DEPARTMENT_OTHER): Payer: Self-pay

## 2024-05-04 MED FILL — Semaglutide (Weight Mngmt) Soln Auto-Injector 2.4 MG/0.75ML: SUBCUTANEOUS | 28 days supply | Qty: 3 | Fill #0 | Status: AC

## 2024-05-12 ENCOUNTER — Ambulatory Visit (INDEPENDENT_AMBULATORY_CARE_PROVIDER_SITE_OTHER): Payer: Self-pay | Admitting: Student

## 2024-05-12 ENCOUNTER — Encounter: Payer: Self-pay | Admitting: Student

## 2024-05-12 ENCOUNTER — Other Ambulatory Visit (HOSPITAL_BASED_OUTPATIENT_CLINIC_OR_DEPARTMENT_OTHER): Payer: Self-pay

## 2024-05-12 VITALS — BP 136/85 | HR 80 | Temp 98.1°F | Ht 67.0 in | Wt 306.4 lb

## 2024-05-12 DIAGNOSIS — Z1159 Encounter for screening for other viral diseases: Secondary | ICD-10-CM

## 2024-05-12 DIAGNOSIS — I10 Essential (primary) hypertension: Secondary | ICD-10-CM | POA: Diagnosis present

## 2024-05-12 DIAGNOSIS — Z1231 Encounter for screening mammogram for malignant neoplasm of breast: Secondary | ICD-10-CM

## 2024-05-12 DIAGNOSIS — Z124 Encounter for screening for malignant neoplasm of cervix: Secondary | ICD-10-CM

## 2024-05-12 DIAGNOSIS — N644 Mastodynia: Secondary | ICD-10-CM

## 2024-05-12 DIAGNOSIS — Z114 Encounter for screening for human immunodeficiency virus [HIV]: Secondary | ICD-10-CM

## 2024-05-12 MED ORDER — HYDROCHLOROTHIAZIDE 12.5 MG PO TABS
12.5000 mg | ORAL_TABLET | Freq: Every day | ORAL | 3 refills | Status: DC
  Filled 2024-05-12 – 2024-06-24 (×2): qty 90, 90d supply, fill #0
  Filled 2024-08-10: qty 90, 90d supply, fill #1

## 2024-05-12 NOTE — Assessment & Plan Note (Addendum)
 Patient presents today with blood pressure elevated at 142/83.  During the clinic blood pressure did come down to 136/81.  Blood pressure is at goal at this time.  Medications include amlodipine  10 mg daily and hydrochlorothiazide  12.5 mg daily.  She denies any shortness of breath, chest pain, vision changes, headaches.  Plan: - Continue amlodipine  10 mg daily - Continue hydrochlorothiazide  12.5 mg daily - The patient encouraged to keep a blood pressure log - Patient encouraged to make lifestyle modifications - Follow-up BMP

## 2024-05-12 NOTE — Patient Instructions (Addendum)
 Amy Keller,Thank you for allowing me to take part in your care today.  Here are your instructions.  1. Order in for mammogram. Please wait to schedule   2. Please continue taking your Amlodipine  and hydrochlorothiazide . Please come back in 3 months.   3. Please keep a log  4. I am checking your kidney function today, please await my phone call or message for the results  5. I am screening your for HIV and hepatitis C. I will call you or message you with the results.    PLEASE BRING YOUR MEDICATIONS TO EVERY APPOINTMENT  Thank you, Dr. Tobie  If you have any other questions please contact the internal medicine clinic at [phone] If it is after hours, please call the [facility] at [phone] and then ask the person who picks up for the resident on call.

## 2024-05-12 NOTE — Progress Notes (Signed)
   CC: Blood pressure follow-up and breast pain  HPI:  Amy Keller is a 38 y.o. female with past medical history of hypertension presents for concerns of breast pain.  Patient also presenting for blood pressure follow-up.  Please see assessment and plan for full HPI.  Past Medical History:  Diagnosis Date   Obesity    Seasonal allergies      Current Outpatient Medications:    amLODipine  (NORVASC ) 10 MG tablet, Take 1 tablet (10 mg total) by mouth daily., Disp: 30 tablet, Rfl: 3   fluticasone  (FLONASE ) 50 MCG/ACT nasal spray, Place 2 sprays into both nostrils daily., Disp: 16 g, Rfl: 3   hydrochlorothiazide  (HYDRODIURIL ) 12.5 MG tablet, Take 1 tablet (12.5 mg total) by mouth daily., Disp: 90 tablet, Rfl: 3   olopatadine  (PATADAY ) 0.1 % ophthalmic solution, Place 1 drop into both eyes 2 (two) times daily., Disp: 5 mL, Rfl: 12   promethazine -dextromethorphan (PROMETHAZINE -DM) 6.25-15 MG/5ML syrup, Take 5 mLs by mouth every 4 (four) hours as needed for cough., Disp: 210 mL, Rfl: 0   semaglutide -weight management (WEGOVY ) 2.4 MG/0.75ML SOAJ SQ injection, Inject 2.4 mg into the skin every 7 (seven) days., Disp: 3 mL, Rfl: 1  Review of Systems:   Breast: Patient endorses right breast pain  Physical Exam:  Vitals:   05/12/24 1508 05/12/24 1517 05/12/24 1531  BP: (!) 142/83 136/81 136/85  Pulse: 88 88 80  Temp: 98.1 F (36.7 C)    TempSrc: Oral    SpO2: 99%    Weight: (!) 306 lb 6.4 oz (139 kg)    Height: 5' 7 (1.702 m)     General: Patient is sitting comfortably in the room  Head: Normocephalic, atraumatic  Breast: Benign breast exam, no obvious signs of lumps, abscess, bruising Cardio: Regular rate and rhythm, no murmurs, rubs or gallops Pulmonary: Clear to ausculation bilaterally with no rales, rhonchi, and crackles    Assessment & Plan:   Assessment & Plan Primary hypertension Patient presents today with blood pressure elevated at 142/83.  During the clinic blood  pressure did come down to 136/81.  Blood pressure is at goal at this time.  Medications include amlodipine  10 mg daily and hydrochlorothiazide  12.5 mg daily.  She denies any shortness of breath, chest pain, vision changes, headaches.  Plan: - Continue amlodipine  10 mg daily - Continue hydrochlorothiazide  12.5 mg daily - The patient encouraged to keep a blood pressure log - Patient encouraged to make lifestyle modifications - Follow-up BMP Breast cancer screening by mammogram Patient due for breast cancer screening.   Plan: - Patient referred for screening mammogram Screening for HIV without presence of risk factors Ordered screening for HIV. Need for hepatitis C screening test Ordered screening for hepatitis C. Breast pain Patient endorses hitting her right breast at work yesterday.  She has since had some achy pain to the area.  On my exam today, no acute signs of bleeding, bruising, abscess, or infection.  Likely deep contusion.  Recommended ice and conservative management.  Patient patient instructed that if she does see an abscess to come back to be evaluated.  Did encourage her to schedule her mammogram once pain has resolved.  Plan: - Supportive care - Schedule mammogram only after pain is resolved Cervical cancer screening History of abnormal Pap.  At next visit Ensure patient has Pap smear as it will be due in November 2025.   Patient discussed with Dr. Ronnald Sergeant  Libby Blanch, DO Internal Medicine Resident PGY-3

## 2024-05-13 ENCOUNTER — Ambulatory Visit: Payer: Self-pay | Admitting: Student

## 2024-05-13 ENCOUNTER — Other Ambulatory Visit (HOSPITAL_BASED_OUTPATIENT_CLINIC_OR_DEPARTMENT_OTHER): Payer: Self-pay

## 2024-05-13 DIAGNOSIS — R928 Other abnormal and inconclusive findings on diagnostic imaging of breast: Secondary | ICD-10-CM

## 2024-05-13 LAB — BASIC METABOLIC PANEL WITH GFR
BUN/Creatinine Ratio: 16 (ref 9–23)
BUN: 10 mg/dL (ref 6–24)
CO2: 23 mmol/L (ref 20–29)
Calcium: 9.8 mg/dL (ref 8.7–10.2)
Chloride: 98 mmol/L (ref 96–106)
Creatinine, Ser: 0.64 mg/dL (ref 0.57–1.00)
Glucose: 77 mg/dL (ref 70–99)
Potassium: 3.1 mmol/L — ABNORMAL LOW (ref 3.5–5.2)
Sodium: 137 mmol/L (ref 134–144)
eGFR: 114 mL/min/1.73 (ref >59)

## 2024-05-13 LAB — HIV ANTIBODY (ROUTINE TESTING W REFLEX): HIV Screen 4th Generation wRfx: NONREACTIVE

## 2024-05-13 LAB — HCV AB W REFLEX TO QUANT PCR: HCV Ab: NONREACTIVE

## 2024-05-13 LAB — HCV INTERPRETATION

## 2024-05-13 MED ORDER — POTASSIUM CHLORIDE ER 20 MEQ PO TBCR
40.0000 meq | EXTENDED_RELEASE_TABLET | Freq: Every day | ORAL | 0 refills | Status: DC
  Filled 2024-05-13: qty 4, 2d supply, fill #0

## 2024-05-13 NOTE — Progress Notes (Signed)
 Internal Medicine Clinic Attending  Case discussed with the resident at the time of the visit.  We reviewed the resident's history and exam and pertinent patient test results.  I agree with the assessment, diagnosis, and plan of care documented in the resident's note.

## 2024-05-14 ENCOUNTER — Other Ambulatory Visit: Payer: Self-pay

## 2024-05-14 ENCOUNTER — Other Ambulatory Visit (HOSPITAL_BASED_OUTPATIENT_CLINIC_OR_DEPARTMENT_OTHER): Payer: Self-pay

## 2024-05-20 ENCOUNTER — Ambulatory Visit
Admission: RE | Admit: 2024-05-20 | Discharge: 2024-05-20 | Disposition: A | Source: Ambulatory Visit | Attending: Internal Medicine | Admitting: Internal Medicine

## 2024-05-20 DIAGNOSIS — Z1231 Encounter for screening mammogram for malignant neoplasm of breast: Secondary | ICD-10-CM

## 2024-05-26 ENCOUNTER — Encounter: Payer: Self-pay | Admitting: Internal Medicine

## 2024-05-28 ENCOUNTER — Other Ambulatory Visit (HOSPITAL_BASED_OUTPATIENT_CLINIC_OR_DEPARTMENT_OTHER): Payer: Self-pay

## 2024-05-28 MED FILL — Semaglutide (Weight Mngmt) Soln Auto-Injector 2.4 MG/0.75ML: SUBCUTANEOUS | 28 days supply | Qty: 3 | Fill #1 | Status: CN

## 2024-06-02 ENCOUNTER — Ambulatory Visit
Admission: RE | Admit: 2024-06-02 | Discharge: 2024-06-02 | Disposition: A | Source: Ambulatory Visit | Attending: Internal Medicine

## 2024-06-02 ENCOUNTER — Ambulatory Visit: Payer: Self-pay | Admitting: Student

## 2024-06-02 ENCOUNTER — Other Ambulatory Visit: Payer: Self-pay | Admitting: Student

## 2024-06-02 DIAGNOSIS — R928 Other abnormal and inconclusive findings on diagnostic imaging of breast: Secondary | ICD-10-CM

## 2024-06-02 DIAGNOSIS — N6489 Other specified disorders of breast: Secondary | ICD-10-CM

## 2024-06-13 ENCOUNTER — Telehealth: Admitting: Nurse Practitioner

## 2024-06-13 DIAGNOSIS — M5441 Lumbago with sciatica, right side: Secondary | ICD-10-CM | POA: Diagnosis not present

## 2024-06-13 DIAGNOSIS — M5442 Lumbago with sciatica, left side: Secondary | ICD-10-CM | POA: Diagnosis not present

## 2024-06-13 MED ORDER — CYCLOBENZAPRINE HCL 10 MG PO TABS
10.0000 mg | ORAL_TABLET | Freq: Three times a day (TID) | ORAL | 0 refills | Status: AC | PRN
  Filled 2024-06-13: qty 30, 10d supply, fill #0

## 2024-06-13 MED ORDER — NAPROXEN 500 MG PO TABS
500.0000 mg | ORAL_TABLET | Freq: Two times a day (BID) | ORAL | 0 refills | Status: AC
  Filled 2024-06-13: qty 60, 30d supply, fill #0

## 2024-06-13 NOTE — Progress Notes (Signed)

## 2024-06-14 ENCOUNTER — Encounter

## 2024-06-14 ENCOUNTER — Other Ambulatory Visit (HOSPITAL_BASED_OUTPATIENT_CLINIC_OR_DEPARTMENT_OTHER): Payer: Self-pay

## 2024-06-24 ENCOUNTER — Other Ambulatory Visit (HOSPITAL_BASED_OUTPATIENT_CLINIC_OR_DEPARTMENT_OTHER): Payer: Self-pay

## 2024-06-24 ENCOUNTER — Other Ambulatory Visit (HOSPITAL_COMMUNITY): Admission: RE | Admit: 2024-06-24 | Discharge: 2024-06-24 | Disposition: A | Source: Ambulatory Visit

## 2024-06-24 ENCOUNTER — Other Ambulatory Visit: Payer: Self-pay

## 2024-06-24 ENCOUNTER — Ambulatory Visit

## 2024-06-24 VITALS — HR 85 | Ht 67.0 in | Wt 305.6 lb

## 2024-06-24 DIAGNOSIS — Z124 Encounter for screening for malignant neoplasm of cervix: Secondary | ICD-10-CM | POA: Diagnosis present

## 2024-06-24 DIAGNOSIS — I10 Essential (primary) hypertension: Secondary | ICD-10-CM

## 2024-06-24 DIAGNOSIS — E876 Hypokalemia: Secondary | ICD-10-CM | POA: Diagnosis not present

## 2024-06-24 DIAGNOSIS — R5383 Other fatigue: Secondary | ICD-10-CM | POA: Insufficient documentation

## 2024-06-24 NOTE — Assessment & Plan Note (Signed)
 Cervical cancer screening today. She is currently sexually active with her partner, but declines STD/STI testing today. No concerns today regarding her cycles, and still gets them regularly. She notes she is due for her cycle soon.  -Pap with cytology today

## 2024-06-24 NOTE — Assessment & Plan Note (Signed)
 Patient reports having felt more fatigued than normal lately. She notes she will feel she cannot get through the day without an energy drink to help her most days. 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 Discussed possible etiologies that can cause fatigue, such as vitamin deficiencies, anemia, or thyroid dysfunction. Denies excessive weight gain, hair loss, nail changes, or palpitations. 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 Patient has never had any of these assessed, will check today.   -TSH -Vitamin D -Vitamin B12 -Iron Panel

## 2024-06-24 NOTE — Patient Instructions (Addendum)
 Thank you, Amy Keller for allowing us  to provide your care today. Today we discussed your need for a Pap smear.    I have ordered the following labs for you:  Lab Orders         Vitamin B12         Vitamin D (25 hydroxy)         Basic metabolic panel with GFR         TSH         Iron, TIBC and Ferritin Panel       I will call if any are abnormal. All of your labs can be accessed through My Chart.   My Chart Access: [email or web address]  Please follow-up in: 3 months for chronic condition follow up     We look forward to seeing you next time. Please call our clinic at [phone] if you have any questions or concerns. The best time to call is Monday-Friday from 9am-4pm, but there is someone available 24/7. If after hours or the weekend, call the main hospital number and ask for the Internal Medicine Resident On-Call. If you need medication refills, please notify your pharmacy one week in advance and they will send us  a request.   Thank you for letting us  take part in your care. Wishing you the best!  Kateena Degroote, DO 06/24/2024, 4:22 PM [facility]

## 2024-06-24 NOTE — Assessment & Plan Note (Deleted)
 Cervical cancer screening today. She is currently sexually active with her partner, but declines STD/STI testing today. No concerns today regarding her cycles, and still gets them regularly. She notes she is due for her cycle soon.  -Pap with cytology today

## 2024-06-24 NOTE — Assessment & Plan Note (Signed)
 BMP drawn at last visit 04/2024 was found to have hypokalemia of 3.1. Was sent potassium chloride  40mEq x 2 doses, and has not had her BMP rechecked since. She would like to check her potassium today.  -BMP

## 2024-06-24 NOTE — Progress Notes (Signed)
 Established Patient Office Visit  Subjective   Patient ID: Amy Keller, female    DOB: [birth year]  Age: 38 y.o. MRN: [id number]  Chief Complaint  Patient presents with   Gynecologic Exam    Pap smear with routien office visit / LMP-patient states is due now.    Amy Keller is a 38 year old female with a past medical history of hypertension who presents today for healthcare maintenance with a routine Pap smear. Please see problem-based assessment and plan below for details.    Review of Systems  Constitutional:  Positive for malaise/fatigue. Negative for chills, diaphoresis, fever and weight loss.  HENT: Negative.    Respiratory: Negative.    Cardiovascular: Negative.   Gastrointestinal: Negative.   Genitourinary: Negative.   Musculoskeletal:  Positive for joint pain.       Right foot pain  Neurological: Negative.   Endo/Heme/Allergies: Negative.   Psychiatric/Behavioral: Negative.       Objective:    Pulse 85   Ht 5' 7 (1.702 m)   Wt (!) 305 lb 9.6 oz (138.6 kg)   SpO2 98%   BMI 47.86 kg/m  Physical Exam Constitutional:      Appearance: Normal appearance.  HENT:     Mouth/Throat:     Mouth: Mucous membranes are moist.  Cardiovascular:     Rate and Rhythm: Normal rate and regular rhythm.     Pulses: Normal pulses.     Heart sounds: Normal heart sounds.  Pulmonary:     Effort: Pulmonary effort is normal.     Breath sounds: Normal breath sounds.  Musculoskeletal:        General: Normal range of motion.     Cervical back: Normal range of motion.  Skin:    General: Skin is warm.  Neurological:     General: No focal deficit present.     Mental Status: She is alert and oriented to person, place, and time.  Psychiatric:        Mood and Affect: Mood normal.        Behavior: Behavior normal.    The ASCVD Risk score (Arnett DK, et al., 2019) failed to calculate for the following reasons:   Cannot find a previous HDL lab   Cannot find a previous total  cholesterol lab   Assessment & Plan:   Patient seen with Dr. Jone Dauphin.  Problem List Items Addressed This Visit       Cardiovascular and Mediastinum   Hypertension   BP 134/83 today. Current regimen includes Amlodipine  10mg  daily, hydrochlorothiazide  12.5mg  daily. Overall reports good adherence to her medications, and takes them daily.   -Continue Amlodipine  10mg , Hydrochlorothiazide  12.5mg  daily        Other   Encounter for screening for cervical cancer - Primary   Cervical cancer screening today. She is currently sexually active with her partner, but declines STD/STI testing today. No concerns today regarding her cycles, and still gets them regularly. She notes she is due for her cycle soon.  -Pap with cytology today      Relevant Orders   Cytology -Pap Smear   Hypokalemia   BMP drawn at last visit 04/2024 was found to have hypokalemia of 3.1. Was sent potassium chloride  40mEq x 2 doses, and has not had her BMP rechecked since. She would like to check her potassium today.  -BMP      Relevant Orders   Basic metabolic panel with GFR   Other fatigue   Patient reports having  felt more fatigued than normal lately. She notes she will feel she cannot get through the day without an energy drink to help her most days. 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 Discussed possible etiologies that can cause fatigue, such as vitamin deficiencies, anemia, or thyroid dysfunction. Denies excessive weight gain, hair loss, nail changes, or palpitations. 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 Patient has never had any of these assessed, will check today.   -TSH -Vitamin D -Vitamin B12 -Iron Panel       Relevant Orders   Vitamin B12   Vitamin D (25 hydroxy)   TSH   Iron, TIBC and Ferritin Panel   Return in about 3 months  (around 09/24/2024) for chronic condition follow up..    Amy Biggar, DO Internal Medicine Resident, PGY-1 6:42 PM 06/24/2024

## 2024-06-24 NOTE — Assessment & Plan Note (Addendum)
 BP 134/83 today. Current regimen includes Amlodipine  10mg  daily, hydrochlorothiazide  12.5mg  daily. Overall reports good adherence to her medications, and takes them daily.   -Continue Amlodipine  10mg , Hydrochlorothiazide  12.5mg  daily

## 2024-06-25 ENCOUNTER — Ambulatory Visit: Payer: Self-pay

## 2024-06-25 DIAGNOSIS — B9689 Other specified bacterial agents as the cause of diseases classified elsewhere: Secondary | ICD-10-CM

## 2024-06-25 LAB — BASIC METABOLIC PANEL WITH GFR
BUN/Creatinine Ratio: 19 (ref 9–23)
BUN: 12 mg/dL (ref 6–24)
CO2: 23 mmol/L (ref 20–29)
Calcium: 9.6 mg/dL (ref 8.7–10.2)
Chloride: 101 mmol/L (ref 96–106)
Creatinine, Ser: 0.62 mg/dL (ref 0.57–1.00)
Glucose: 79 mg/dL (ref 70–99)
Potassium: 3.6 mmol/L (ref 3.5–5.2)
Sodium: 140 mmol/L (ref 134–144)
eGFR: 115 mL/min/1.73 (ref >59)

## 2024-06-25 LAB — IRON,TIBC AND FERRITIN PANEL
Ferritin: 39 ng/mL (ref 15–150)
Iron Saturation: 23 % (ref 15–55)
Iron: 74 ug/dL (ref 27–159)
Total Iron Binding Capacity: 316 ug/dL (ref 250–450)
UIBC: 242 ug/dL (ref 131–425)

## 2024-06-25 LAB — VITAMIN B12: Vitamin B-12: 941 pg/mL (ref 232–1245)

## 2024-06-25 LAB — TSH: TSH: 2.26 u[IU]/mL (ref 0.450–4.500)

## 2024-06-25 LAB — VITAMIN D 25 HYDROXY (VIT D DEFICIENCY, FRACTURES): Vit D, 25-Hydroxy: 23 ng/mL — ABNORMAL LOW (ref 30.0–100.0)

## 2024-06-25 NOTE — Progress Notes (Signed)
 Vitamin D insufficient, patient will need to begin vitamin D3 supplementation at 2000 IU daily.  B12, TSH, BMP WNL. Iron panel showing ferritin 39 and saturation at 23, suggesting possible iron deficiency. Called and spoke with patient about labs, and patient does confirm she occasionally will get RLS. She will pick up OTC Vitamin D3 and ferrous sulfate 325mg  for her fatigue and RLS symptoms and monitor for improvement.

## 2024-06-29 LAB — CYTOLOGY - PAP: Diagnosis: NEGATIVE

## 2024-06-29 NOTE — Progress Notes (Signed)
 Internal Medicine Clinic Attending  I was physically present during the key portions of the resident provided service and participated in the medical decision making of patient's management care. I reviewed pertinent patient test results.  The assessment, diagnosis, and plan were formulated together and I agree with the documentation in the resident's note.  Shawn Sick, MD

## 2024-06-30 ENCOUNTER — Other Ambulatory Visit (HOSPITAL_BASED_OUTPATIENT_CLINIC_OR_DEPARTMENT_OTHER): Payer: Self-pay

## 2024-06-30 MED ORDER — METRONIDAZOLE 500 MG PO TABS
500.0000 mg | ORAL_TABLET | Freq: Two times a day (BID) | ORAL | 0 refills | Status: DC
  Filled 2024-06-30: qty 14, 7d supply, fill #0

## 2024-06-30 MED FILL — Semaglutide (Weight Mngmt) Soln Auto-Injector 2.4 MG/0.75ML: SUBCUTANEOUS | 28 days supply | Qty: 3 | Fill #1 | Status: CN

## 2024-06-30 NOTE — Progress Notes (Signed)
 Pap cytology negative for HPV or malignant changes. Flora suggestive of BV, patient confirms recent irritation and itching without discharge. Will send Flagyl 500mg  BID for 7 days to treat.

## 2024-07-01 ENCOUNTER — Other Ambulatory Visit (HOSPITAL_BASED_OUTPATIENT_CLINIC_OR_DEPARTMENT_OTHER): Payer: Self-pay

## 2024-07-02 ENCOUNTER — Other Ambulatory Visit (HOSPITAL_BASED_OUTPATIENT_CLINIC_OR_DEPARTMENT_OTHER): Payer: Self-pay

## 2024-07-03 ENCOUNTER — Other Ambulatory Visit (HOSPITAL_BASED_OUTPATIENT_CLINIC_OR_DEPARTMENT_OTHER): Payer: Self-pay

## 2024-07-05 ENCOUNTER — Telehealth: Payer: Self-pay

## 2024-07-05 NOTE — Telephone Encounter (Signed)
 Received a pa request for Wegovy . Patient has medicaid. Medicaid no longer covers GLP-1. Lvm for patient to schedule a appointment/video visit to discuss alternative weight loss options/ placing a referral to healthy weight and wellness.

## 2024-07-06 ENCOUNTER — Other Ambulatory Visit (HOSPITAL_BASED_OUTPATIENT_CLINIC_OR_DEPARTMENT_OTHER): Payer: Self-pay

## 2024-07-07 ENCOUNTER — Other Ambulatory Visit (HOSPITAL_BASED_OUTPATIENT_CLINIC_OR_DEPARTMENT_OTHER): Payer: Self-pay

## 2024-07-08 ENCOUNTER — Other Ambulatory Visit (HOSPITAL_BASED_OUTPATIENT_CLINIC_OR_DEPARTMENT_OTHER): Payer: Self-pay

## 2024-07-09 ENCOUNTER — Encounter: Payer: Self-pay | Admitting: Pharmacist

## 2024-07-09 ENCOUNTER — Other Ambulatory Visit: Payer: Self-pay

## 2024-07-13 ENCOUNTER — Other Ambulatory Visit (HOSPITAL_BASED_OUTPATIENT_CLINIC_OR_DEPARTMENT_OTHER): Payer: Self-pay

## 2024-07-14 ENCOUNTER — Other Ambulatory Visit: Payer: Self-pay

## 2024-07-22 ENCOUNTER — Telehealth

## 2024-08-11 ENCOUNTER — Other Ambulatory Visit (HOSPITAL_BASED_OUTPATIENT_CLINIC_OR_DEPARTMENT_OTHER): Payer: Self-pay

## 2024-08-11 ENCOUNTER — Other Ambulatory Visit: Payer: Self-pay

## 2024-08-30 ENCOUNTER — Ambulatory Visit: Payer: Self-pay

## 2024-08-30 NOTE — Telephone Encounter (Signed)
 FYI Only or Action Required?: Action required by provider: clinical question for provider.  Patient was last seen in primary care on 06/24/2024 by Amilibia, Jaden, DO.  Called Nurse Triage reporting Numbness.  Symptoms began ongoing.  Interventions attempted: Other: braces.  Symptoms are: gradually worsening.  Triage Disposition: See PCP When Office is Open (Within 3 Days)  Patient/caregiver understands and will follow disposition?: No, wishes to speak with PCP  Copied from CRM #[id number]. Topic: Clinical - Red Word Triage >> Aug 30, 2024  1:40 PM Fredrica W wrote: Red Word that prompted transfer to Nurse Triage: worsening symptoms - carpal tunnel getting worse - numbess in ring finger will not go away - originally request physical appt    ----------------------------------------------------------------------- From previous Reason for Contact - Scheduling: Patient/patient representative is calling to schedule an appointment. Refer to attachments for appointment information. Reason for Disposition  [1] Weakness of arm / hand, or leg / foot AND [2] is a chronic symptom (recurrent or ongoing AND present > 4 weeks)  Answer Assessment - Initial Assessment Questions Pt states she has ongoing carpal tunnel. She states numbness is between her ring finger and middle finger on right hand. Outsides of both fingers are not numb. 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  RN attempted to schedule pt. Pt was walking into another appt. She would like to see Jaden. She states that she would like a call back on if she can just drop the paperwork off. She'd like to see someone for the carpal tunnel if possible. She is requesting a call back and advised it is ok to leave a detailed VM if she does not answer.   Initially pt stated she had no symptoms, then spoke up about the carpal tunnel. Due to pt having to leave convo for an appt, not all questions answered.      1.  SYMPTOM: What is the main symptom you are concerned about? (e.g., weakness, numbness)     Numbness on right hand between ring and middle finger, denies any numbness/weakness anywhere else 2. ONSET: When did this start? (e.g., minutes, hours, days; while sleeping)     ongoing 3. LAST NORMAL: When was the last time you (the patient) were normal (no symptoms)?      4. PATTERN Does this come and go, or has it been constant since it started?  Is it present now?     Pretty constant 5. CARDIAC SYMPTOMS: Have you had any of the following symptoms: chest pain, difficulty breathing, palpitations?      6. NEUROLOGIC SYMPTOMS: Have you had any of the following symptoms: headache, dizziness, vision loss, double vision, changes in speech, unsteady on your feet?      7. OTHER SYMPTOMS: Do you have any other symptoms?  Protocols used: Neurologic Deficit-A-AH

## 2024-08-30 NOTE — Telephone Encounter (Signed)
 Will forward to Dr. Charmayne.

## 2024-09-01 ENCOUNTER — Other Ambulatory Visit (HOSPITAL_BASED_OUTPATIENT_CLINIC_OR_DEPARTMENT_OTHER): Payer: Self-pay

## 2024-09-01 ENCOUNTER — Ambulatory Visit: Payer: Self-pay | Admitting: Student

## 2024-09-01 VITALS — BP 132/55 | HR 84 | Temp 98.1°F | Ht 67.0 in | Wt 332.8 lb

## 2024-09-01 DIAGNOSIS — I10 Essential (primary) hypertension: Secondary | ICD-10-CM

## 2024-09-01 DIAGNOSIS — G5600 Carpal tunnel syndrome, unspecified upper limb: Secondary | ICD-10-CM

## 2024-09-01 DIAGNOSIS — L910 Hypertrophic scar: Secondary | ICD-10-CM | POA: Diagnosis not present

## 2024-09-01 DIAGNOSIS — G5601 Carpal tunnel syndrome, right upper limb: Secondary | ICD-10-CM

## 2024-09-01 MED ORDER — HYDROCHLOROTHIAZIDE 25 MG PO TABS
25.0000 mg | ORAL_TABLET | Freq: Every day | ORAL | 2 refills | Status: AC
  Filled 2024-09-01: qty 60, 60d supply, fill #0

## 2024-09-01 NOTE — Patient Instructions (Addendum)
 Thank you, Amy Keller for allowing us  to provide your care today. Today we discussed your keloid and also addressed your blood pressure.  BP Readings from Last 3 Encounters:  09/01/24 (!) 132/55  05/12/24 136/85  11/19/23 (!) 126/55   Since your blood pressure remains elevated, I am increasing your hydrochlorothiazide  back to 25 mg. Please let us  know if you begin feeling woozy again. I recommend that you start measuring your blood pressure at home two hours after taking your medication and again before bedtime. Please bring your blood pressure log with you to your next appointment.   I have ordered the following labs for you:  Lab Orders  No laboratory test(s) ordered today     Tests ordered today:    Referrals ordered today:   Referral Orders  No referral(s) requested today     I have ordered the following medication/changed the following medications:   Stop the following medications: Medications Discontinued During This Encounter  Medication Reason   metroNIDAZOLE  (FLAGYL ) 500 MG tablet    hydrochlorothiazide  (HYDRODIURIL ) 12.5 MG tablet      Start the following medications: Meds ordered this encounter  Medications   hydrochlorothiazide  (HYDRODIURIL ) 25 MG tablet    Sig: Take 1 tablet (25 mg total) by mouth daily.    Dispense:  60 tablet    Refill:  2     Follow up: 3 months   Remember:  Should you have any questions or concerns please call the internal medicine clinic at [phone].   Drue Lisa Grow MD 09/01/2024, 4:25 PM   [facility]

## 2024-09-01 NOTE — Assessment & Plan Note (Signed)
 History of carpal tunnel syndrome. Patient previously tried a wrist splint with improvement but discontinued use. She reports some numbness in the median eminence. Patient will restart use of the wrist splint.

## 2024-09-01 NOTE — Assessment & Plan Note (Signed)
 BP Readings from Last 3 Encounters:  09/01/24 (!) 132/55  05/12/24 136/85  11/19/23 (!) 126/55   Blood pressure today is 132/55. Patient is currently taking amlodipine  10 mg and hydrochlorothiazide  12.5 mg. She reported one episode of feeling woozy, which she attributed to the medication, and hydrochlorothiazide  was decreased to 12.5 mg in September of last year. Since that time, her blood pressure has remained above goal. We discussed increasing hydrochlorothiazide  back to 25 mg and monitoring blood pressure at home for which she agrees. - Continue amlodipine  10 mg - Increase hydrochlorothiazide  from 12.5 to 25 mg daily  - Keep BP log

## 2024-09-01 NOTE — Progress Notes (Signed)
 Internal Medicine Clinic Attending  Case discussed with the resident at the time of the visit.  We reviewed the resident's history and exam and pertinent patient test results.  I agree with the assessment, diagnosis, and plan of care documented in the resident's note.

## 2024-09-01 NOTE — Progress Notes (Signed)
 "  CC: Routine office visit    HPI: [name] is a 39 y.o. female living with a history stated below and presents today for routine office visit. Please see problem based assessment and plan for additional details.  Past Medical History:  Diagnosis Date   Obesity    Seasonal allergies     Medications Ordered Prior to Encounter[1]  No family history on file.  Social History   Socioeconomic History   Marital status: Single    Spouse name: Not on file   Number of children: Not on file   Years of education: Not on file   Highest education level: Associate degree: occupational, scientist, product/process development, or vocational program  Occupational History   Not on file  Tobacco Use   Smoking status: Never   Smokeless tobacco: Never  Vaping Use   Vaping status: Never Used  Substance and Sexual Activity   Alcohol use: No   Drug use: No   Sexual activity: Not on file  Other Topics Concern   Not on file  Social History Narrative   Not on file   Social Drivers of Health   Tobacco Use: Low Risk (06/24/2024)   Patient History    Smoking Tobacco Use: Never    Smokeless Tobacco Use: Never    Passive Exposure: Not on file  Financial Resource Strain: Low Risk (08/31/2024)   Overall Financial Resource Strain (CARDIA)    Difficulty of Paying Living Expenses: Not very hard  Food Insecurity: Food Insecurity Present (08/31/2024)   Epic    Worried About Programme Researcher, Broadcasting/film/video in the Last Year: Never true    Ran Out of Food in the Last Year: Sometimes true  Transportation Needs: No Transportation Needs (08/31/2024)   Epic    Lack of Transportation (Medical): No    Lack of Transportation (Non-Medical): No  Physical Activity: Insufficiently Active (08/31/2024)   Exercise Vital Sign    Days of Exercise per Week: 2 days    Minutes of Exercise per Session: 20 min  Stress: Stress Concern Present (08/31/2024)   [employer] of Occupational Health - Occupational Stress Questionnaire    Feeling of  Stress: Rather much  Social Connections: Moderately Isolated (08/31/2024)   Social Connection and Isolation Panel    Frequency of Communication with Friends and Family: More than three times a week    Frequency of Social Gatherings with Friends and Family: More than three times a week    Attends Religious Services: More than 4 times per year    Active Member of [employer] or Organizations: No    Attends Engineer, Structural: Not on file    Marital Status: Never married  Intimate Partner Violence: Not At Risk (04/28/2023)   Humiliation, Afraid, Rape, and Kick questionnaire    Fear of Current or Ex-Partner: No    Emotionally Abused: No    Physically Abused: No    Sexually Abused: No  Depression (PHQ2-9): Low Risk (06/24/2024)   Depression (PHQ2-9)    PHQ-2 Score: 0  Alcohol Screen: Low Risk (08/31/2024)   Alcohol Screen    Last Alcohol Screening Score (AUDIT): 2  Housing: Patient Declined (08/31/2024)   Epic    Unable to Pay for Housing in the Last Year: Patient declined    Number of Times Moved in the Last Year: Not on file    Homeless in the Last Year: Patient declined  Utilities: Not on file  Health Literacy: Not on file    Review of  Systems: ROS negative except for what is noted on the assessment and plan.  Vitals:   09/01/24 1549  BP: (!) 132/55  Pulse: 84  Temp: 98.1 F (36.7 C)  TempSrc: Oral  SpO2: 100%  Weight: (!) 332 lb 12.8 oz (151 kg)  Height: 5' 7 (1.702 m)    Physical Exam: Constitutional: Obese appearing woman , sitting in chair , in no acute distress HENT: normocephalic atraumatic, mucous membranes moist Eyes: conjunctiva non-erythematous Cardiovascular: regular rate and rhythm, no m/r/g Pulmonary/Chest: normal work of breathing on room air, lungs clear to auscultation bilaterally MSK: normal bulk and tone  Neurological: alert & oriented x 3, no focal deficit Skin: warm and dry Psych: normal mood and behavior  Assessment & Plan:    Hypertension BP Readings from Last 3 Encounters:  09/01/24 (!) 132/55  05/12/24 136/85  11/19/23 (!) 126/55   Blood pressure today is 132/55. Patient is currently taking amlodipine  10 mg and hydrochlorothiazide  12.5 mg. She reported one episode of feeling woozy, which she attributed to the medication, and hydrochlorothiazide  was decreased to 12.5 mg in September of last year. Since that time, her blood pressure has remained above goal. We discussed increasing hydrochlorothiazide  back to 25 mg and monitoring blood pressure at home for which she agrees. - Continue amlodipine  10 mg - Increase hydrochlorothiazide  from 12.5 to 25 mg daily  - Keep BP log   Carpal tunnel syndrome History of carpal tunnel syndrome. Patient previously tried a wrist splint with improvement but discontinued use. She reports some numbness in the median eminence. Patient will restart use of the wrist splint.  Keloid scar Please see Physical exam above. Agreed to dermatology referral. - Derm referral placed      Paperwork for job application filled out today   Patient discussed with Dr. Shawn Drue Keller, M.D [facility] Phone: [phone] Date 09/01/2024 Time 4:44 PM      [1]  Current Outpatient Medications on File Prior to Visit  Medication Sig Dispense Refill   amLODipine  (NORVASC ) 10 MG tablet Take 1 tablet (10 mg total) by mouth daily. 30 tablet 3   cyclobenzaprine  (FLEXERIL ) 10 MG tablet Take 1 tablet (10 mg total) by mouth 3 (three) times daily as needed for muscle spasms. 30 tablet 0   fluticasone  (FLONASE ) 50 MCG/ACT nasal spray Place 2 sprays into both nostrils daily. 16 g 3   naproxen  (NAPROSYN ) 500 MG tablet Take 1 tablet (500 mg total) by mouth 2 (two) times daily with a meal. 60 tablet 0   semaglutide -weight management (WEGOVY ) 2.4 MG/0.75ML SOAJ SQ injection Inject 2.4 mg into the skin every 7 (seven) days. 3 mL 1   No current facility-administered medications on  file prior to visit.   "

## 2024-09-01 NOTE — Assessment & Plan Note (Signed)
 Please see Physical exam above. Agreed to dermatology referral. - Derm referral placed

## 2024-09-02 ENCOUNTER — Other Ambulatory Visit (HOSPITAL_BASED_OUTPATIENT_CLINIC_OR_DEPARTMENT_OTHER): Payer: Self-pay

## 2024-09-02 MED ORDER — WEGOVY 0.25 MG/0.5ML ~~LOC~~ SOAJ
0.5000 mL | SUBCUTANEOUS | 0 refills | Status: AC
  Filled 2024-09-02: qty 2, 28d supply, fill #0

## 2024-09-02 MED ORDER — PROGESTERONE MICRONIZED 100 MG PO CAPS
100.0000 mg | ORAL_CAPSULE | Freq: Every day | ORAL | 2 refills | Status: AC
  Filled 2024-09-02: qty 30, 30d supply, fill #0

## 2024-09-03 ENCOUNTER — Other Ambulatory Visit (HOSPITAL_BASED_OUTPATIENT_CLINIC_OR_DEPARTMENT_OTHER): Payer: Self-pay

## 2024-09-04 ENCOUNTER — Telehealth: Admitting: Family Medicine

## 2024-09-04 DIAGNOSIS — M545 Low back pain, unspecified: Secondary | ICD-10-CM

## 2024-09-04 NOTE — Progress Notes (Signed)
" °  Because of the severity of your symptoms and because the pain is worsened from the previous e-visit, I feel your condition warrants further evaluation and I recommend that you be seen in a face-to-face in person visit.   NOTE: There will be NO CHARGE for this E-Visit   If you are having a true medical emergency, please call 911.     For an urgent face to face visit, [place] has multiple urgent care centers for your convenience.  Click the link below for the full list of locations and hours, walk-in wait times, appointment scheduling options and driving directions:  Urgent Care - [place], [place], [place], [place], [place], [place]  [place]     Your MyChart E-visit questionnaire answers were reviewed by a board certified advanced clinical practitioner to complete your personal care plan based on your specific symptoms.    Thank you for using e-Visits.      I have spent 5 minutes in review of e-visit questionnaire, review and updating patient chart, medical decision making and response to patient.   [name], PA-C    "

## 2024-09-07 ENCOUNTER — Telehealth: Admitting: Family Medicine

## 2024-09-07 ENCOUNTER — Other Ambulatory Visit (HOSPITAL_BASED_OUTPATIENT_CLINIC_OR_DEPARTMENT_OTHER): Payer: Self-pay

## 2024-09-07 DIAGNOSIS — R070 Pain in throat: Secondary | ICD-10-CM

## 2024-09-07 NOTE — Patient Instructions (Signed)
 " Amy Keller, thank you for joining [name], PA-C for today's virtual visit.  While this provider is not your primary care provider (PCP), if your PCP is located in our provider database this encounter information will be shared with them immediately following your visit.   A [facility] MyChart account gives you access to today's visit and all your visits, tests, and labs performed at [facility]  click here if you don't have a [facility] MyChart account or go to [email or web address]  Consent: (Patient) Amy Keller provided verbal consent for this virtual visit at the beginning of the encounter.  Current Medications:  Current Outpatient Medications:    amLODipine  (NORVASC ) 10 MG tablet, Take 1 tablet (10 mg total) by mouth daily., Disp: 30 tablet, Rfl: 3   cyclobenzaprine  (FLEXERIL ) 10 MG tablet, Take 1 tablet (10 mg total) by mouth 3 (three) times daily as needed for muscle spasms., Disp: 30 tablet, Rfl: 0   fluticasone  (FLONASE ) 50 MCG/ACT nasal spray, Place 2 sprays into both nostrils daily., Disp: 16 g, Rfl: 3   hydrochlorothiazide  (HYDRODIURIL ) 25 MG tablet, Take 1 tablet (25 mg total) by mouth daily., Disp: 60 tablet, Rfl: 2   naproxen  (NAPROSYN ) 500 MG tablet, Take 1 tablet (500 mg total) by mouth 2 (two) times daily with a meal., Disp: 60 tablet, Rfl: 0   progesterone  (PROMETRIUM ) 100 MG capsule, Take 1 capsule (100 mg total) by mouth at bedtime., Disp: 30 capsule, Rfl: 2   semaglutide -weight management (WEGOVY ) 0.25 MG/0.5ML SOAJ SQ injection, Inject 0.25 mg into the skin once a week., Disp: 4 mL, Rfl: 0   semaglutide -weight management (WEGOVY ) 2.4 MG/0.75ML SOAJ SQ injection, Inject 2.4 mg into the skin every 7 (seven) days., Disp: 3 mL, Rfl: 1   Medications ordered in this encounter:  No orders of the defined types were placed in this encounter.    *If you need refills on other medications prior to your next appointment, please contact your  pharmacy*  Follow-Up: Call back or seek an in-person evaluation if the symptoms worsen or if the condition fails to improve as anticipated.   Virtual Care [phone]  Other Instructions Sore Throat A sore throat is pain, burning, irritation, or scratchiness in the throat. When you have a sore throat, you may feel pain or tenderness in your throat when you swallow or talk. Many things can cause a sore throat, including: An infection. Seasonal allergies. Dryness in the air. Irritants, such as smoke or pollution. Radiation treatment for cancer. Gastroesophageal reflux disease (GERD). A tumor. A sore throat is often the first sign of another sickness. It may happen with other symptoms, such as coughing, sneezing, fever, and swollen neck glands. Most sore throats go away without medical treatment. Follow these instructions at home:     Medicines Take over-the-counter and prescription medicines only as told by your health care provider. Children often get sore throats. Do not give your child aspirin because of the association with Reye's syndrome. Use throat sprays to soothe your throat as told by your health care provider. Managing pain To help with pain, try: Sipping warm liquids, such as broth, herbal tea, or warm water. Eating or drinking cold or frozen liquids, such as frozen ice pops. Gargling with a mixture of salt and water 3-4 times a day or as needed. To make salt water, completely dissolve -1 tsp (3-6 g) of salt in 1 cup (237 mL) of warm water. Sucking on hard candy or throat  lozenges. Putting a cool-mist humidifier in your bedroom at night to moisten the air. Sitting in the bathroom with the door closed for 5-10 minutes while you run hot water in the shower. General instructions Do not use any products that contain nicotine or tobacco. These products include cigarettes, chewing tobacco, and vaping devices, such as e-cigarettes. If you need help quitting, ask  your health care provider. Rest as needed. Drink enough fluid to keep your urine pale yellow. Wash your hands often with soap and water for at least 20 seconds. If soap and water are not available, use hand sanitizer. Contact a health care provider if: You have a fever for more than 2-3 days. You have symptoms that last for more than 2-3 days. Your throat does not get better within 7 days. You have a fever and your symptoms suddenly get worse. Get help right away if: You have difficulty breathing. You cannot swallow fluids, soft foods, or your saliva. You have increased swelling in your throat or neck. You have persistent nausea and vomiting. These symptoms may represent a serious problem that is an emergency. Do not wait to see if the symptoms will go away. Get medical help right away. Call your local emergency services (911 in the [place] Do not drive yourself to the hospital. Summary A sore throat is pain, burning, irritation, or scratchiness in the throat. Many things can cause a sore throat. Take over-the-counter medicines only as told by your health care provider. Rest as needed. Drink enough fluid to keep your urine pale yellow. Contact a health care provider if your throat does not get better within 7 days. This information is not intended to replace advice given to you by your health care provider. Make sure you discuss any questions you have with your health care provider. Document Revised: 11/01/2020 Document Reviewed: 11/01/2020 Elsevier Patient Education  2024 Elsevier Inc.   If you have been instructed to have an in-person evaluation today at a local Urgent Care facility, please use the link below. It will take you to a list of all of our available [facility], including address, phone number and hours of operation. Please do not delay care.  [facility]  If you or a family member do not have a primary care provider, use the link below to schedule  a visit and establish care. When you choose a [facility] primary care physician or advanced practice provider, you gain a long-term partner in health. Find a Primary Care Provider  Learn more about [facility] in-office and virtual care options: [facility] - Get Care Now  "

## 2024-09-07 NOTE — Progress Notes (Signed)
 " Virtual Visit Consent   Amy Keller, you are scheduled for a virtual visit with a [facility] provider today. Just as with appointments in the office, your consent must be obtained to participate. Your consent will be active for this visit and any virtual visit you may have with one of our providers in the next 365 days. If you have a MyChart account, a copy of this consent can be sent to you electronically.  As this is a virtual visit, video technology does not allow for your provider to perform a traditional examination. This may limit your provider's ability to fully assess your condition. If your provider identifies any concerns that need to be evaluated in person or the need to arrange testing (such as labs, EKG, etc.), we will make arrangements to do so. Although advances in technology are sophisticated, we cannot ensure that it will always work on either your end or our end. If the connection with a video visit is poor, the visit may have to be switched to a telephone visit. With either a video or telephone visit, we are not always able to ensure that we have a secure connection.  By engaging in this virtual visit, you consent to the provision of healthcare and authorize for your insurance to be billed (if applicable) for the services provided during this visit. Depending on your insurance coverage, you may receive a charge related to this service.  I need to obtain your verbal consent now. Are you willing to proceed with your visit today? ARIYANNAH Keller has provided verbal consent on 09/07/2024 for a virtual visit (video or telephone). Amy Keller, [place]  Date: 09/07/2024 7:35 PM   Virtual Visit via Video Note   I, Amy Keller, connected with  Amy Keller  ([id number], [birth year]) on 09/07/24 at  7:30 PM EST by a video-enabled telemedicine application and verified that I am speaking with the correct person using two identifiers.  Location: Patient: Virtual Visit Location Patient:  Home Provider: Virtual Visit Location Provider: Home Office   I discussed the limitations of evaluation and management by telemedicine and the availability of in person appointments. The patient expressed understanding and agreed to proceed.    History of Present Illness: Amy Keller is a 39 y.o. who identifies as a female who was assigned female at birth, and is being seen today for c/o sore throat on right side. Pt states she felt a little pain with swallowing.  Pt states she has white spots on her throat. Pt denies fever and cough.  Pt states she has some congestion. Pt states taking Aleve  for pain and using flonase .  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   HPI: HPI  Problems:  Patient Active Problem List   Diagnosis Date Noted   Encounter for screening for cervical cancer 06/24/2024   Hypokalemia 06/24/2024   Other fatigue 06/24/2024   Seasonal allergies 11/19/2023   Carpal tunnel syndrome 11/19/2023   Keloid scar 05/28/2023   Healthcare maintenance 05/28/2023   Hypertension 04/29/2023   Weight loss 04/29/2023   Tinea corporis 04/29/2023   OBESITY, NOS 10/16/2006   TENSION HEADACHE 10/16/2006   RHINITIS, ALLERGIC 10/16/2006    Allergies: Allergies[1] Medications: Current Medications[2]  Observations/Objective: Patient is well-developed, well-nourished in no acute distress.  Resting comfortably  at home.  Head is normocephalic, atraumatic.  No labored breathing.  Speech is clear and coherent with logical content.  Patient is alert and oriented at baseline.  -  partially able to visualize throat, no erythema noted some slight white spots noted to tonsil pockets    Assessment and Plan: 1. Throat pain in adult (Primary)  -Advised Pt warm water gargles and could be viral given she has no fever, severe throat pain or other associated symptoms  -Pt to follow up if symptoms persist or worsen   Follow Up Instructions: I discussed the assessment  and treatment plan with the patient. The patient was provided an opportunity to ask questions and all were answered. The patient agreed with the plan and demonstrated an understanding of the instructions.  A copy of instructions were sent to the patient via MyChart unless otherwise noted below.    The patient was advised to call back or seek an in-person evaluation if the symptoms worsen or if the condition fails to improve as anticipated.    Amy Mater, PA-C    [1] No Known Allergies [2]  Current Outpatient Medications:    amLODipine  (NORVASC ) 10 MG tablet, Take 1 tablet (10 mg total) by mouth daily., Disp: 30 tablet, Rfl: 3   cyclobenzaprine  (FLEXERIL ) 10 MG tablet, Take 1 tablet (10 mg total) by mouth 3 (three) times daily as needed for muscle spasms., Disp: 30 tablet, Rfl: 0   fluticasone  (FLONASE ) 50 MCG/ACT nasal spray, Place 2 sprays into both nostrils daily., Disp: 16 g, Rfl: 3   hydrochlorothiazide  (HYDRODIURIL ) 25 MG tablet, Take 1 tablet (25 mg total) by mouth daily., Disp: 60 tablet, Rfl: 2   naproxen  (NAPROSYN ) 500 MG tablet, Take 1 tablet (500 mg total) by mouth 2 (two) times daily with a meal., Disp: 60 tablet, Rfl: 0   progesterone  (PROMETRIUM ) 100 MG capsule, Take 1 capsule (100 mg total) by mouth at bedtime., Disp: 30 capsule, Rfl: 2   semaglutide -weight management (WEGOVY ) 0.25 MG/0.5ML SOAJ SQ injection, Inject 0.25 mg into the skin once a week., Disp: 4 mL, Rfl: 0   semaglutide -weight management (WEGOVY ) 2.4 MG/0.75ML SOAJ SQ injection, Inject 2.4 mg into the skin every 7 (seven) days., Disp: 3 mL, Rfl: 1  "

## 2024-09-09 ENCOUNTER — Other Ambulatory Visit (HOSPITAL_BASED_OUTPATIENT_CLINIC_OR_DEPARTMENT_OTHER): Payer: Self-pay

## 2024-09-09 ENCOUNTER — Other Ambulatory Visit: Payer: Self-pay

## 2024-09-09 MED ORDER — AMLODIPINE BESYLATE 10 MG PO TABS
10.0000 mg | ORAL_TABLET | Freq: Every day | ORAL | 3 refills | Status: AC
  Filled 2024-09-09: qty 30, 30d supply, fill #0

## 2024-09-09 MED ORDER — AMOXICILLIN 500 MG PO CAPS
500.0000 mg | ORAL_CAPSULE | Freq: Three times a day (TID) | ORAL | 0 refills | Status: AC
  Filled 2024-09-09: qty 30, 10d supply, fill #0

## 2024-09-10 ENCOUNTER — Other Ambulatory Visit (HOSPITAL_BASED_OUTPATIENT_CLINIC_OR_DEPARTMENT_OTHER): Payer: Self-pay

## 2024-12-03 ENCOUNTER — Encounter

## 2025-05-25 ENCOUNTER — Ambulatory Visit: Admitting: Physician Assistant
